# Patient Record
Sex: Male | Born: 1955 | Race: White | Hispanic: No | Marital: Single | Smoking: Never smoker
Health system: Southern US, Community
[De-identification: ages and names within clinical notes are randomized; demographics above are authoritative.]

## PROBLEM LIST (undated history)

## (undated) DIAGNOSIS — C4490 Unspecified malignant neoplasm of skin, unspecified: Secondary | ICD-10-CM

## (undated) DIAGNOSIS — G4730 Sleep apnea, unspecified: Secondary | ICD-10-CM

## (undated) DIAGNOSIS — E785 Hyperlipidemia, unspecified: Secondary | ICD-10-CM

## (undated) DIAGNOSIS — M1990 Unspecified osteoarthritis, unspecified site: Secondary | ICD-10-CM

## (undated) DIAGNOSIS — R7303 Prediabetes: Secondary | ICD-10-CM

## (undated) HISTORY — DX: Hyperlipidemia, unspecified: E78.5

---
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
## 1997-12-03 HISTORY — PX: HAND TENDON SURGERY: SHX663

## 1998-06-09 ENCOUNTER — Observation Stay (HOSPITAL_COMMUNITY): Admission: EM | Admit: 1998-06-09 | Discharge: 1998-06-09 | Payer: Self-pay | Admitting: Emergency Medicine

## 1999-06-26 ENCOUNTER — Inpatient Hospital Stay (HOSPITAL_COMMUNITY): Admission: EM | Admit: 1999-06-26 | Discharge: 1999-06-27 | Payer: Self-pay

## 1999-06-26 ENCOUNTER — Encounter: Payer: Self-pay | Admitting: Emergency Medicine

## 1999-07-03 ENCOUNTER — Ambulatory Visit (HOSPITAL_COMMUNITY): Admission: RE | Admit: 1999-07-03 | Discharge: 1999-07-03 | Payer: Self-pay

## 2005-04-19 ENCOUNTER — Ambulatory Visit (HOSPITAL_COMMUNITY): Admission: RE | Admit: 2005-04-19 | Discharge: 2005-04-19 | Payer: Self-pay | Admitting: Family Medicine

## 2005-04-30 ENCOUNTER — Emergency Department (HOSPITAL_COMMUNITY): Admission: EM | Admit: 2005-04-30 | Discharge: 2005-04-30 | Payer: Self-pay | Admitting: Emergency Medicine

## 2005-05-11 ENCOUNTER — Ambulatory Visit (HOSPITAL_COMMUNITY): Admission: RE | Admit: 2005-05-11 | Discharge: 2005-05-11 | Payer: Self-pay | Admitting: Neurosurgery

## 2005-12-03 HISTORY — PX: BACK SURGERY: SHX140

## 2012-10-18 ENCOUNTER — Encounter (HOSPITAL_COMMUNITY): Payer: Self-pay | Admitting: Emergency Medicine

## 2012-10-18 ENCOUNTER — Emergency Department (HOSPITAL_COMMUNITY)
Admission: EM | Admit: 2012-10-18 | Discharge: 2012-10-18 | Disposition: A | Discharge status: Home / Self Care | Payer: No Typology Code available for payment source | Attending: Emergency Medicine | Admitting: Emergency Medicine

## 2012-10-18 DIAGNOSIS — M549 Dorsalgia, unspecified: Secondary | ICD-10-CM

## 2012-10-18 DIAGNOSIS — S139XXA Sprain of joints and ligaments of unspecified parts of neck, initial encounter: Secondary | ICD-10-CM | POA: Insufficient documentation

## 2012-10-18 DIAGNOSIS — M545 Low back pain, unspecified: Secondary | ICD-10-CM | POA: Insufficient documentation

## 2012-10-18 DIAGNOSIS — S161XXA Strain of muscle, fascia and tendon at neck level, initial encounter: Secondary | ICD-10-CM

## 2012-10-18 DIAGNOSIS — Y9389 Activity, other specified: Secondary | ICD-10-CM | POA: Insufficient documentation

## 2012-10-18 MED ORDER — IBUPROFEN 400 MG PO TABS
600.0000 mg | ORAL_TABLET | Freq: Once | ORAL | Status: AC
  Administered 2012-10-18: 600 mg via ORAL
  Filled 2012-10-18: qty 1

## 2012-10-18 MED ORDER — NAPROXEN 500 MG PO TABS: 500.0000 mg | ORAL_TABLET | Freq: Two times a day (BID) | ORAL | Status: DC | PRN

## 2012-10-18 MED ORDER — DIAZEPAM 5 MG PO TABS: 5.0000 mg | ORAL_TABLET | Freq: Three times a day (TID) | ORAL | Status: DC | PRN
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
## 2017-03-07 ENCOUNTER — Ambulatory Visit: Payer: Self-pay | Admitting: Surgery

## 2017-03-07 NOTE — H&P (Signed)
Rodney Dougherty 03/07/2017 10:42 AM Location: [facility] Patient #: [id number] DOB: Jun 27, 1956 Divorced / Language: Rodney Dougherty / Race: White Male  History of Present Illness (Rodney Lazar A. Kae Heller Dougherty; 03/07/2017 12:33 Dougherty) Patient words: This very nice 61yo gentleman who presents with a recurrent left inguinal hernia. He started to notice a bulge about a year ago and it has increased in size. Does cause some sensation of pulling and stretching. No issues with incarceration, nausea vomiting or change in lower GI function. He had it repaired open with mesh in his early 70s. His other abdominal surgical history includes a pyloromyotomy as an infant. He does not smoke, occasional alcohol use, works as a Animator. Only medication is aspirin which he takes when he thinks about it.  The patient is a 61 year old male.   Past Surgical History Rodney Dougherty, Rodney Dougherty; 03/07/2017 11:31 AM) Laparoscopic Inguinal Hernia Surgery Left. Spinal Surgery - Lower Back  Diagnostic Studies History Rodney Dougherty, Rodney Dougherty; 03/07/2017 11:31 AM) Colonoscopy 5-10 years ago  Allergies Rodney Dougherty, Rodney Dougherty; 03/07/2017 11:32 AM) No Known Drug Allergies 03/07/2017  Medication History Rodney Dougherty, Rodney Dougherty; 03/07/2017 11:33 AM) No Current Medications Medications Reconciled  Social History Rodney Dougherty, Rodney Dougherty; 03/07/2017 11:31 AM) Alcohol use Occasional alcohol use. Caffeine use Carbonated beverages, Coffee, Tea. No drug use Tobacco use Never smoker.  Family History Rodney Dougherty, Rodney Dougherty; 03/07/2017 11:31 AM) Arthritis Mother. Heart Disease Brother. Heart disease in male family member before age 60 Respiratory Condition Mother.  Other Problems Rodney Dougherty, Rodney Dougherty; 03/07/2017 11:31 AM) Back Pain Hypercholesterolemia Inguinal Hernia     Review of Systems (Rodney Dougherty) General Not Present- Appetite Loss, Chills, Fatigue, Fever, Night Sweats, Weight Gain and Weight Loss. Skin Not Present-  Change in Wart/Mole, Dryness, Hives, Jaundice, New Lesions, Non-Healing Wounds, Rash and Ulcer. HEENT Not Present- Earache, Hearing Loss, Hoarseness, Nose Bleed, Oral Ulcers, Ringing in the Ears, Seasonal Allergies, Sinus Pain, Sore Throat, Visual Disturbances, Wears glasses/contact lenses and Yellow Eyes. Respiratory Not Present- Bloody sputum, Chronic Cough, Difficulty Breathing, Snoring and Wheezing. Breast Not Present- Breast Mass, Breast Pain, Nipple Discharge and Skin Changes. Cardiovascular Not Present- Chest Pain, Difficulty Breathing Lying Down, Leg Cramps, Palpitations, Rapid Heart Rate, Shortness of Breath and Swelling of Extremities. Gastrointestinal Not Present- Abdominal Pain, Bloating, Bloody Stool, Change in Bowel Habits, Chronic diarrhea, Constipation, Difficulty Swallowing, Excessive gas, Gets full quickly at meals, Hemorrhoids, Indigestion, Nausea, Rectal Pain and Vomiting. Male Genitourinary Not Present- Blood in Urine, Change in Urinary Stream, Frequency, Impotence, Nocturia, Painful Urination, Urgency and Urine Leakage. All other systems negative  Vitals (Rodney Dougherty Rodney Dougherty; 03/07/2017 11:32 AM) 03/07/2017 11:32 AM Weight: 194.5 lb Height: 68in Body Surface Area: 2.02 m Body Mass Index: 29.57 kg/m  Temp.: 97.89F  Pulse: 82 (Regular)  BP: 140/88 (Sitting, Left Arm, Standard)      Physical Exam (Rodney Dougherty; 03/07/2017 12:36 Dougherty)  General Note: He is alert and oriented, no distress  Integumentary Note: No lesions or rashes on limited skin exam  Head and Neck Note: No mass or thyromegaly  Eye Note: Anicteric, ocular motions intact  ENMT Note: Moist mucous membranes, a few missing teeth  Chest and Lung Exam Note: unlabored respiration, symmetrical air entry  Cardiovascular Note: reg rate and rhythm, no pedal edema  Abdomen Note: soft, nontender, nondistended. Small umbilical hernia. Reducible left inguinal hernia, no hernia on the  right. 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 I recommended laparoscopic repair given that he had repaired open in the past. I discussed with him the nature of the surgery including the use of the new piece of mesh, risks of bleeding, infection, pain, scarring, chronic groin pain, bladder injury or intra-abdominal injury, and recurrence of the hernia. He expressed understanding and desires to proceed.

## 2017-04-10 NOTE — Pre-Procedure Instructions (Signed)
Rodney Dougherty  04/10/2017      [facility], [place] [address] Phone: [phone] Fax: [phone]    Your procedure is scheduled on May 15  Report to [facility] Admitting at 1200 P.M.  Call this number if you have problems the morning of surgery:  [phone]   Remember:  Do not eat food or drink liquids after midnight.   Take these medicines the morning of surgery with A SIP OF WATER NONE  7 days prior to surgery STOP taking any Aspirin, Aleve, Naproxen, Ibuprofen, Motrin, Advil, Goody's, BC's, all herbal medications, fish oil, and all vitamins    Do not wear jewelry.  Do not wear lotions, powders, or cologne, or deoderant.   Men may shave face and neck.  Do not bring valuables to the hospital.  [facility] is not responsible for any belongings or valuables.  Contacts, dentures or bridgework may not be worn into surgery.  Leave your suitcase in the car.  After surgery it may be brought to your room.  For patients admitted to the hospital, discharge time will be determined by your treatment team.  Patients discharged the day of surgery will not be allowed to drive home.    Special instructions:   [place]- Preparing For Surgery  Before surgery, you can play an important role. Because skin is not sterile, your skin needs to be as free of germs as possible. You can reduce the number of germs on your skin by washing with CHG (chlorahexidine gluconate) Soap before surgery.  CHG is an antiseptic cleaner which kills germs and bonds with the skin to continue killing germs even after washing.  Please do not use if you have an allergy to CHG or antibacterial soaps. If your skin becomes reddened/irritated stop using the CHG.  Do not shave (including legs and underarms) for at least 48 hours prior to first CHG shower. It is OK to shave your face.  Please follow these  instructions carefully.   1. Shower the NIGHT BEFORE SURGERY and the MORNING OF SURGERY with CHG.   2. If you chose to wash your hair, wash your hair first as usual with your normal shampoo.  3. After you shampoo, rinse your hair and body thoroughly to remove the shampoo.  4. Use CHG as you would any other liquid soap. You can apply CHG directly to the skin and wash gently with a scrungie or a clean washcloth.   5. Apply the CHG Soap to your body ONLY FROM THE NECK DOWN.  Do not use on open wounds or open sores. Avoid contact with your eyes, ears, mouth and genitals (private parts). Wash genitals (private parts) with your normal soap.  6. Wash thoroughly, paying special attention to the area where your surgery will be performed.  7. Thoroughly rinse your body with warm water from the neck down.  8. DO NOT shower/wash with your normal soap after using and rinsing off the CHG Soap.  9. Pat yourself dry with a CLEAN TOWEL.   10. Wear CLEAN PAJAMAS   11. Place CLEAN SHEETS on your bed the night of your first shower and DO NOT SLEEP WITH PETS.    Day of Surgery: Do not apply any deodorants/lotions. Please wear clean clothes to the hospital/surgery center.      Please read over the following fact sheets that you were given.

## 2017-04-11 ENCOUNTER — Encounter (HOSPITAL_COMMUNITY)
Admission: RE | Admit: 2017-04-11 | Discharge: 2017-04-11 | Disposition: A | Discharge status: Home / Self Care | Payer: BLUE CROSS/BLUE SHIELD | Source: Ambulatory Visit | Attending: Surgery | Admitting: Surgery

## 2017-04-11 ENCOUNTER — Encounter (HOSPITAL_COMMUNITY): Payer: Self-pay

## 2017-04-11 DIAGNOSIS — D176 Benign lipomatous neoplasm of spermatic cord: Secondary | ICD-10-CM | POA: Diagnosis not present

## 2017-04-11 DIAGNOSIS — K4091 Unilateral inguinal hernia, without obstruction or gangrene, recurrent: Secondary | ICD-10-CM | POA: Diagnosis present

## 2017-04-11 DIAGNOSIS — K429 Umbilical hernia without obstruction or gangrene: Secondary | ICD-10-CM | POA: Diagnosis not present

## 2017-04-11 LAB — BASIC METABOLIC PANEL
Anion gap: 9 (ref 5–15)
BUN: 18 mg/dL (ref 6–20)
CHLORIDE: 110 mmol/L (ref 101–111)
CO2: 22 mmol/L (ref 22–32)
CREATININE: 0.97 mg/dL (ref 0.61–1.24)
Calcium: 8.9 mg/dL (ref 8.9–10.3)
GFR calc Af Amer: >60 mL/min (ref >60)
GFR calc non Af Amer: >60 mL/min (ref >60)
GLUCOSE: 115 mg/dL — AB (ref 65–99)
Potassium: 4.2 mmol/L (ref 3.5–5.1)
SODIUM: 141 mmol/L (ref 135–145)

## 2017-04-11 LAB — CBC WITH DIFFERENTIAL/PLATELET
Basophils Absolute: 0 10*3/uL (ref 0.0–0.1)
Basophils Relative: 1 %
EOS ABS: 0.3 10*3/uL (ref 0.0–0.7)
EOS PCT: 5 %
HCT: 45.9 % (ref 39.0–52.0)
Hemoglobin: 15.5 g/dL (ref 13.0–17.0)
LYMPHS PCT: 27 %
Lymphs Abs: 1.7 10*3/uL (ref 0.7–4.0)
MCH: 28.5 pg (ref 26.0–34.0)
MCHC: 33.8 g/dL (ref 30.0–36.0)
MCV: 84.4 fL (ref 78.0–100.0)
MONO ABS: 0.5 10*3/uL (ref 0.1–1.0)
Monocytes Relative: 8 %
Neutro Abs: 3.7 10*3/uL (ref 1.7–7.7)
Neutrophils Relative %: 59 %
PLATELETS: 216 10*3/uL (ref 150–400)
RBC: 5.44 MIL/uL (ref 4.22–5.81)
RDW: 13.4 % (ref 11.5–15.5)
WBC: 6.2 10*3/uL (ref 4.0–10.5)

## 2017-04-11 NOTE — Progress Notes (Signed)
Pt. Denies all chest concerns.  Pt. Denies ever having any advanced cardiac surveillance, in fact doesn't think he has ever had an EKG.  [facility]P- [facility].

## 2017-04-16 ENCOUNTER — Ambulatory Visit (HOSPITAL_COMMUNITY): Payer: BLUE CROSS/BLUE SHIELD | Admitting: Anesthesiology

## 2017-04-16 ENCOUNTER — Encounter (HOSPITAL_COMMUNITY): Payer: Self-pay

## 2017-04-16 ENCOUNTER — Encounter (HOSPITAL_COMMUNITY)
Admission: RE | Disposition: A | Discharge status: Home / Self Care | Payer: Self-pay | Source: Ambulatory Visit | Attending: Surgery

## 2017-04-16 ENCOUNTER — Ambulatory Visit (HOSPITAL_COMMUNITY)
Admission: RE | Admit: 2017-04-16 | Discharge: 2017-04-16 | Disposition: A | Discharge status: Home / Self Care | Payer: BLUE CROSS/BLUE SHIELD | Source: Ambulatory Visit | Attending: Surgery | Admitting: Surgery

## 2017-04-16 DIAGNOSIS — K4091 Unilateral inguinal hernia, without obstruction or gangrene, recurrent: Secondary | ICD-10-CM | POA: Diagnosis not present

## 2017-04-16 DIAGNOSIS — D176 Benign lipomatous neoplasm of spermatic cord: Secondary | ICD-10-CM | POA: Insufficient documentation

## 2017-04-16 DIAGNOSIS — K429 Umbilical hernia without obstruction or gangrene: Secondary | ICD-10-CM | POA: Insufficient documentation

## 2017-04-16 HISTORY — PX: INGUINAL HERNIA REPAIR: SHX194

## 2017-04-16 HISTORY — PX: INSERTION OF MESH: SHX5868

## 2017-04-16 SURGERY — REPAIR, HERNIA, INGUINAL, LAPAROSCOPIC
Anesthesia: General | Laterality: Left

## 2017-04-16 MED ORDER — ROCURONIUM BROMIDE 10 MG/ML (PF) SYRINGE
PREFILLED_SYRINGE | INTRAVENOUS | Status: AC
  Filled 2017-04-16: qty 5

## 2017-04-16 MED ORDER — EPHEDRINE 5 MG/ML INJ
INTRAVENOUS | Status: AC
  Filled 2017-04-16: qty 10

## 2017-04-16 MED ORDER — SODIUM CHLORIDE 0.9 % IV SOLN: 250.0000 mL | INTRAVENOUS | Status: DC | PRN

## 2017-04-16 MED ORDER — ACETAMINOPHEN 500 MG PO TABS
1000.0000 mg | ORAL_TABLET | ORAL | Status: AC
  Administered 2017-04-16: 1000 mg via ORAL
  Filled 2017-04-16: qty 2

## 2017-04-16 MED ORDER — NEOSTIGMINE METHYLSULFATE 5 MG/5ML IV SOSY
PREFILLED_SYRINGE | INTRAVENOUS | Status: AC
  Filled 2017-04-16: qty 5

## 2017-04-16 MED ORDER — CHLORHEXIDINE GLUCONATE 4 % EX LIQD: 60.0000 mL | Freq: Once | CUTANEOUS | Status: DC

## 2017-04-16 MED ORDER — DEXAMETHASONE SODIUM PHOSPHATE 10 MG/ML IJ SOLN
INTRAMUSCULAR | Status: DC | PRN
  Administered 2017-04-16: 10 mg via INTRAVENOUS

## 2017-04-16 MED ORDER — FENTANYL CITRATE (PF) 100 MCG/2ML IJ SOLN: 25.0000 ug | INTRAMUSCULAR | Status: DC | PRN

## 2017-04-16 MED ORDER — BUPIVACAINE HCL (PF) 0.25 % IJ SOLN
INTRAMUSCULAR | Status: AC
  Filled 2017-04-16: qty 30

## 2017-04-16 MED ORDER — DOCUSATE SODIUM 100 MG PO CAPS: 100.0000 mg | ORAL_CAPSULE | Freq: Two times a day (BID) | ORAL | 0 refills | Status: AC

## 2017-04-16 MED ORDER — ACETAMINOPHEN 325 MG PO TABS: 650.0000 mg | ORAL_TABLET | ORAL | Status: DC | PRN

## 2017-04-16 MED ORDER — STERILE WATER FOR IRRIGATION IR SOLN
Status: DC | PRN
  Administered 2017-04-16: 1000 mL

## 2017-04-16 MED ORDER — GABAPENTIN 300 MG PO CAPS
300.0000 mg | ORAL_CAPSULE | ORAL | Status: AC
  Administered 2017-04-16: 300 mg via ORAL
  Filled 2017-04-16: qty 1

## 2017-04-16 MED ORDER — MIDAZOLAM HCL 5 MG/5ML IJ SOLN
INTRAMUSCULAR | Status: DC | PRN
  Administered 2017-04-16: 2 mg via INTRAVENOUS

## 2017-04-16 MED ORDER — ONDANSETRON HCL 4 MG/2ML IJ SOLN
INTRAMUSCULAR | Status: DC | PRN
  Administered 2017-04-16: 4 mg via INTRAVENOUS

## 2017-04-16 MED ORDER — PROPOFOL 10 MG/ML IV BOLUS
INTRAVENOUS | Status: AC
  Filled 2017-04-16: qty 20

## 2017-04-16 MED ORDER — OXYCODONE HCL 5 MG PO TABS: 5.0000 mg | ORAL_TABLET | ORAL | Status: DC | PRN

## 2017-04-16 MED ORDER — SUGAMMADEX SODIUM 200 MG/2ML IV SOLN
INTRAVENOUS | Status: DC | PRN
  Administered 2017-04-16: 200 mg via INTRAVENOUS

## 2017-04-16 MED ORDER — SODIUM CHLORIDE 0.9% FLUSH: 3.0000 mL | Freq: Two times a day (BID) | INTRAVENOUS | Status: DC

## 2017-04-16 MED ORDER — ROCURONIUM BROMIDE 100 MG/10ML IV SOLN
INTRAVENOUS | Status: DC | PRN
  Administered 2017-04-16: 20 mg via INTRAVENOUS
  Administered 2017-04-16: 40 mg via INTRAVENOUS

## 2017-04-16 MED ORDER — MIDAZOLAM HCL 2 MG/2ML IJ SOLN
INTRAMUSCULAR | Status: AC
  Filled 2017-04-16: qty 2

## 2017-04-16 MED ORDER — CEFAZOLIN SODIUM-DEXTROSE 2-4 GM/100ML-% IV SOLN
2.0000 g | INTRAVENOUS | Status: AC
  Administered 2017-04-16: 2 g via INTRAVENOUS
  Filled 2017-04-16: qty 100

## 2017-04-16 MED ORDER — PROPOFOL 10 MG/ML IV BOLUS
INTRAVENOUS | Status: DC | PRN
  Administered 2017-04-16: 150 mg via INTRAVENOUS

## 2017-04-16 MED ORDER — 0.9 % SODIUM CHLORIDE (POUR BTL) OPTIME
TOPICAL | Status: DC | PRN
  Administered 2017-04-16: 1000 mL

## 2017-04-16 MED ORDER — PHENYLEPHRINE 40 MCG/ML (10ML) SYRINGE FOR IV PUSH (FOR BLOOD PRESSURE SUPPORT)
PREFILLED_SYRINGE | INTRAVENOUS | Status: AC
  Filled 2017-04-16: qty 10

## 2017-04-16 MED ORDER — LIDOCAINE HCL (CARDIAC) 20 MG/ML IV SOLN
INTRAVENOUS | Status: DC | PRN
  Administered 2017-04-16: 100 mg via INTRAVENOUS

## 2017-04-16 MED ORDER — FENTANYL CITRATE (PF) 100 MCG/2ML IJ SOLN
INTRAMUSCULAR | Status: DC | PRN
  Administered 2017-04-16: 50 ug via INTRAVENOUS
  Administered 2017-04-16: 150 ug via INTRAVENOUS
  Administered 2017-04-16: 50 ug via INTRAVENOUS

## 2017-04-16 MED ORDER — DEXTROSE 5 % IV SOLN
INTRAVENOUS | Status: DC | PRN
  Administered 2017-04-16: 15:00:00 via INTRAVENOUS

## 2017-04-16 MED ORDER — OXYCODONE-ACETAMINOPHEN 5-325 MG PO TABS: 1.0000 | ORAL_TABLET | Freq: Four times a day (QID) | ORAL | 0 refills | Status: DC | PRN

## 2017-04-16 MED ORDER — PHENYLEPHRINE HCL 10 MG/ML IJ SOLN
INTRAMUSCULAR | Status: DC | PRN
  Administered 2017-04-16: 80 ug via INTRAVENOUS

## 2017-04-16 MED ORDER — HYDROMORPHONE HCL 1 MG/ML IJ SOLN: 0.2500 mg | INTRAMUSCULAR | Status: DC | PRN

## 2017-04-16 MED ORDER — CELECOXIB 200 MG PO CAPS
400.0000 mg | ORAL_CAPSULE | ORAL | Status: AC
  Administered 2017-04-16: 400 mg via ORAL
  Filled 2017-04-16: qty 2

## 2017-04-16 MED ORDER — FENTANYL CITRATE (PF) 250 MCG/5ML IJ SOLN
INTRAMUSCULAR | Status: AC
  Filled 2017-04-16: qty 5

## 2017-04-16 MED ORDER — LACTATED RINGERS IV SOLN
INTRAVENOUS | Status: DC | PRN
  Administered 2017-04-16 (×2): via INTRAVENOUS

## 2017-04-16 MED ORDER — LIDOCAINE 2% (20 MG/ML) 5 ML SYRINGE
INTRAMUSCULAR | Status: AC
  Filled 2017-04-16: qty 5

## 2017-04-16 MED ORDER — ACETAMINOPHEN 650 MG RE SUPP: 650.0000 mg | RECTAL | Status: DC | PRN

## 2017-04-16 MED ORDER — SUGAMMADEX SODIUM 200 MG/2ML IV SOLN
INTRAVENOUS | Status: AC
  Filled 2017-04-16: qty 2

## 2017-04-16 MED ORDER — BUPIVACAINE HCL (PF) 0.25 % IJ SOLN
INTRAMUSCULAR | Status: DC | PRN
  Administered 2017-04-16: 20 mL

## 2017-04-16 MED ORDER — SODIUM CHLORIDE 0.9% FLUSH: 3.0000 mL | INTRAVENOUS | Status: DC | PRN

## 2017-04-16 SURGICAL SUPPLY — 39 items
ADH SKN CLS APL DERMABOND .7 (GAUZE/BANDAGES/DRESSINGS) ×1
APPLIER CLIP LOGIC TI 5 (MISCELLANEOUS) IMPLANT
APR CLP MED LRG 33X5 (MISCELLANEOUS)
CANISTER SUCT 3000ML PPV (MISCELLANEOUS) IMPLANT
COVER SURGICAL LIGHT HANDLE (MISCELLANEOUS) ×3 IMPLANT
DERMABOND ADVANCED (GAUZE/BANDAGES/DRESSINGS) ×2
DERMABOND ADVANCED .7 DNX12 (GAUZE/BANDAGES/DRESSINGS) ×1 IMPLANT
DEVICE PMI PUNCTURE CLOSURE (MISCELLANEOUS) ×3 IMPLANT
DEVICE SECURE STRAP 25 ABSORB (INSTRUMENTS) ×6 IMPLANT
DISSECT BALLN SPACEMKR + OVL (BALLOONS)
DISSECTOR BALLN SPACEMKR + OVL (BALLOONS) IMPLANT
DISSECTOR BLUNT TIP ENDO 5MM (MISCELLANEOUS) IMPLANT
ELECT REM PT RETURN 9FT ADLT (ELECTROSURGICAL) ×3
ELECTRODE REM PT RTRN 9FT ADLT (ELECTROSURGICAL) ×1 IMPLANT
GLOVE BIO SURGEON STRL SZ 6 (GLOVE) ×3 IMPLANT
GLOVE BIOGEL PI IND STRL 6.5 (GLOVE) ×1 IMPLANT
GLOVE BIOGEL PI IND STRL 7.5 (GLOVE) ×1 IMPLANT
GLOVE BIOGEL PI INDICATOR 6.5 (GLOVE) ×2
GLOVE BIOGEL PI INDICATOR 7.5 (GLOVE) ×2
GOWN STRL REUS W/ TWL LRG LVL3 (GOWN DISPOSABLE) ×3 IMPLANT
GOWN STRL REUS W/TWL LRG LVL3 (GOWN DISPOSABLE) ×9
KIT BASIN OR (CUSTOM PROCEDURE TRAY) ×3 IMPLANT
KIT ROOM TURNOVER OR (KITS) ×3 IMPLANT
MESH PHASIX RESORB RECT 10X15 (Mesh General) ×3 IMPLANT
NEEDLE INSUFFLATION 14GA 120MM (NEEDLE) IMPLANT
NS IRRIG 1000ML POUR BTL (IV SOLUTION) ×3 IMPLANT
PAD ARMBOARD 7.5X6 YLW CONV (MISCELLANEOUS) ×6 IMPLANT
SCISSORS LAP 5X35 DISP (ENDOMECHANICALS) ×3 IMPLANT
SET IRRIG TUBING LAPAROSCOPIC (IRRIGATION / IRRIGATOR) IMPLANT
SET TROCAR LAP APPLE-HUNT 5MM (ENDOMECHANICALS) IMPLANT
SLEEVE ENDOPATH XCEL 5M (ENDOMECHANICALS) ×3 IMPLANT
SUT MNCRL AB 4-0 PS2 18 (SUTURE) ×3 IMPLANT
TOWEL OR 17X24 6PK STRL BLUE (TOWEL DISPOSABLE) ×3 IMPLANT
TOWEL OR 17X26 10 PK STRL BLUE (TOWEL DISPOSABLE) ×3 IMPLANT
TRAY FOLEY CATH SILVER 16FR (SET/KITS/TRAYS/PACK) ×3 IMPLANT
TRAY LAPAROSCOPIC MC (CUSTOM PROCEDURE TRAY) ×3 IMPLANT
TROCAR XCEL NON-BLD 11X100MML (ENDOMECHANICALS) ×3 IMPLANT
TROCAR XCEL NON-BLD 5MMX100MML (ENDOMECHANICALS) ×3 IMPLANT
TUBING INSUFFLATION (TUBING) ×3 IMPLANT

## 2017-04-16 NOTE — Transfer of Care (Signed)
Immediate Anesthesia Transfer of Care Note  Patient: Rodney Dougherty  Procedure(s) Performed: Procedure(s): LAPAROSCOPIC LEFT INGUINAL HERNIA REPAIR (Left) INSERTION OF MESH (Left)  Patient Location: PACU  Anesthesia Type:General  Level of Consciousness: awake, oriented, sedated, patient cooperative and responds to stimulation  Airway & Oxygen Therapy: Patient Spontanous Breathing and Patient connected to nasal cannula oxygen  Post-op Assessment: Report given to RN, Post -op Vital signs reviewed and stable, Patient moving all extremities and Patient moving all extremities X 4  Post vital signs: Reviewed and stable  Last Vitals:  Vitals:   04/16/17 1224 04/16/17 1618  BP: 131/70 139/80  Pulse: (!) 56 (!) 58  Resp: 18   Temp: 36.4 C 36.5 C    Last Pain:  Vitals:   04/16/17 1224  TempSrc: Oral  PainSc:       Patients Stated Pain Goal: 1 (95/32/02 3343)  Complications: No apparent anesthesia complications

## 2017-04-16 NOTE — Anesthesia Procedure Notes (Signed)
Procedure Name: Intubation Date/Time: 04/16/2017 2:22 PM Performed by: Neldon Newport Pre-anesthesia Checklist: Timeout performed, Patient being monitored, Suction available, Emergency Drugs available and Patient identified Patient Re-evaluated:Patient Re-evaluated prior to inductionOxygen Delivery Method: Circle system utilized Preoxygenation: Pre-oxygenation with 100% oxygen Intubation Type: IV induction Ventilation: Mask ventilation without difficulty Laryngoscope Size: Mac and 4 Grade View: Grade I Tube type: Oral Tube size: 7.5 mm Number of attempts: 1 Placement Confirmation: breath sounds checked- equal and bilateral,  positive ETCO2 and ETT inserted through vocal cords under direct vision Secured at: 22 cm Tube secured with: Tape Dental Injury: Teeth and Oropharynx as per pre-operative assessment

## 2017-04-16 NOTE — Anesthesia Preprocedure Evaluation (Signed)
Anesthesia Evaluation  Patient identified by MRN, date of birth, ID band Patient awake    Reviewed: Allergy & Precautions, H&P , Patient's Chart, lab work & pertinent test results, reviewed documented beta blocker date and time   Airway Mallampati: II  TM Distance: >3 FB Neck ROM: full    Dental no notable dental hx.    Pulmonary    Pulmonary exam normal breath sounds clear to auscultation       Cardiovascular  Rhythm:regular Rate:Normal     Neuro/Psych    GI/Hepatic   Endo/Other    Renal/GU      Musculoskeletal   Abdominal   Peds  Hematology   Anesthesia Other Findings   Reproductive/Obstetrics                             Anesthesia Physical Anesthesia Plan  ASA: II  Anesthesia Plan: General   Post-op Pain Management:    Induction: Intravenous  Airway Management Planned: Oral ETT  Additional Equipment:   Intra-op Plan:   Post-operative Plan: Extubation in OR  Informed Consent: I have reviewed the patients History and Physical, chart, labs and discussed the procedure including the risks, benefits and alternatives for the proposed anesthesia with the patient or authorized representative who has indicated his/her understanding and acceptance.   Dental Advisory Given  Plan Discussed with: CRNA and Surgeon  Anesthesia Plan Comments: (  )        Anesthesia Quick Evaluation

## 2017-04-16 NOTE — Discharge Instructions (Signed)
HERNIA REPAIR: POST OP INSTRUCTIONS  ######################################################################  EAT Gradually transition to a high fiber diet with a fiber supplement over the next few weeks after discharge.  Start with a pureed / full liquid diet (see below)  WALK Walk an hour a day.  Control your pain to do that.    CONTROL PAIN Control pain so that you can walk, sleep, tolerate sneezing/coughing, go up/down stairs.  HAVE A BOWEL MOVEMENT DAILY Keep your bowels regular to avoid problems.  OK to try a laxative to override constipation.  OK to use an antidairrheal to slow down diarrhea.  Call if not better after 2 tries  CALL IF YOU HAVE PROBLEMS/CONCERNS Call if you are still struggling despite following these instructions. Call if you have concerns not answered by these instructions  ######################################################################    1. DIET: Follow a light bland diet the first 24 hours after arrival home, such as soup, liquids, crackers, etc.  Be sure to include lots of fluids daily.  Avoid fast food or heavy meals as your are more likely to get nauseated.  Eat a low fat the next few days after surgery. 2. Take your usually prescribed home medications unless otherwise directed. 3. PAIN CONTROL: a. Pain is best controlled by a usual combination of three different methods TOGETHER: i. Ice/Heat ii. Over the counter pain medication iii. Prescription pain medication b. Most patients will experience some swelling and bruising around the hernia(s) such as the bellybutton, groins, or old incisions.  Ice packs or heating pads (30-60 minutes up to 6 times a day) will help. Use ice for the first few days to help decrease swelling and bruising, then switch to heat to help relax tight/sore spots and speed recovery.  Some people prefer to use ice alone, heat alone, alternating between ice & heat.  Experiment to what works for you.  Swelling and bruising can take  several weeks to resolve.   c. It is helpful to take an over-the-counter pain medication regularly for the first few weeks.  Choose one of the following that works best for you: i. Naproxen (Aleve, etc)  Two 220mg  tabs twice a day ii. Ibuprofen (Advil, etc) Three 200mg  tabs four times a day (every meal & bedtime) iii. Acetaminophen (Tylenol, etc) 325-650mg  four times a day (every meal & bedtime) d. A  prescription for pain medication should be given to you upon discharge.  Take your pain medication as prescribed.  i. If you are having problems/concerns with the prescription medicine (does not control pain, nausea, vomiting, rash, itching, etc), please call us [phone] to see if we need to switch you to a different pain medicine that will work better for you and/or control your side effect better. ii. If you need a refill on your pain medication, please contact your pharmacy.  They will contact our office to request authorization. Prescriptions will not be filled after 5 pm or on week-ends. 4. Avoid getting constipated.  Between the surgery and the pain medications, it is common to experience some constipation.  Increasing fluid intake and taking a fiber supplement (such as Metamucil, Citrucel, FiberCon, MiraLax, etc) 1-2 times a day regularly will usually help prevent this problem from occurring.  A mild laxative (prune juice, Milk of Magnesia, MiraLax, etc) should be taken according to package directions if there are no bowel movements after 48 hours.   5. Wash / shower every day.  You may shower over the skin glue which is water proof.  6. The skin glue will flake off after a couple weeks.  You may leave the incision open to air.  You may replace a dressing/Band-Aid to cover the incision for comfort if you wish.      7. ACTIVITIES as tolerated:   a. You may resume regular (light) daily activities beginning the next day--such as daily self-care, walking, climbing stairs--gradually increasing  activities as tolerated.  If you can walk 30 minutes without difficulty, it is safe to try more intense activity such as jogging, treadmill, bicycling, low-impact aerobics, swimming, etc. b. Save the most intensive and strenuous activity for last such as sit-ups, heavy lifting, contact sports, etc  Refrain from any heavy lifting or straining until you are off narcotics for pain control.   c. DO NOT PUSH THROUGH PAIN.  Let pain be your guide: If it hurts to do something, don't do it.  Pain is your body warning you to avoid that activity for another week until the pain goes down. d. You may drive when you are no longer taking prescription pain medication, you can comfortably wear a seatbelt, and you can safely maneuver your car and apply brakes. e. Dennis Bast may have sexual intercourse when it is comfortable.  8. FOLLOW UP in our office a. Please call [facility] at [phone] to set up an appointment to see your surgeon in the office for a follow-up appointment approximately 2-3 weeks after your surgery. b. Make sure that you call for this appointment the day you arrive home to insure a convenient appointment time. 9.  IF YOU HAVE DISABILITY OR FAMILY LEAVE FORMS, BRING THEM TO THE OFFICE FOR PROCESSING.  DO NOT GIVE THEM TO YOUR DOCTOR.  WHEN TO CALL us [phone]: 1. Poor pain control 2. Reactions / problems with new medications (rash/itching, nausea, etc)  3. Fever over 101.5 F (38.5 C) 4. Inability to urinate 5. Nausea and/or vomiting 6. Worsening swelling or bruising 7. Continued bleeding from incision. 8. Increased pain, redness, or drainage from the incision   The clinic staff is available to answer your questions during regular business hours (8:30am-5pm).  Please dont hesitate to call and ask to speak to one of our nurses for clinical concerns.   If you have a medical emergency, go to the nearest emergency room or call 911.  A surgeon from [facility] is always on call at the  hospitals in [facility], [place], [place], [place], [place]  [address] ?  [address] [address], [place], [place]   [address] MAIN: [phone] ? TOLL FREE: [phone] ? FAX: [phone] [email or web address]

## 2017-04-16 NOTE — H&P (Signed)
Rodney Dougherty Patient #: [id number] DOB: [birth year] Divorced / Language: [name] / Race: White Male  History of Present Illness  Patient words: This very nice 61yo gentleman who presents with a recurrent left inguinal hernia. He started to notice a bulge about a year ago and it has increased in size. Does cause some sensation of pulling and stretching. No issues with incarceration, nausea vomiting or change in lower GI function. He had it repaired open with mesh in his early 52s. His other abdominal surgical history includes a pyloromyotomy as an infant. He does not smoke, occasional alcohol use, works as a Animator. Only medication is aspirin which he takes when he thinks about it.    Past Surgical History Laparoscopic Inguinal Hernia Surgery Left. Spinal Surgery - Lower Back  Diagnostic Studies History  Colonoscopy 5-10 years ago  Allergies  No Known Drug Allergies 03/07/2017  Medication History  No Current Medications Medications Reconciled  Social History  Alcohol use Occasional alcohol use. Caffeine use Carbonated beverages, Coffee, Tea. No drug use Tobacco use Never smoker.  Family History  Arthritis Mother. Heart Disease Brother. Heart disease in male family member before age 46 Respiratory Condition Mother.  Other Problems  Back Pain Hypercholesterolemia Inguinal Hernia     Review of Systems  General Not Present- Appetite Loss, Chills, Fatigue, Fever, Night Sweats, Weight Gain and Weight Loss. Skin Not Present- Change in Wart/Mole, Dryness, Hives, Jaundice, New Lesions, Non-Healing Wounds, Rash and Ulcer. HEENT Not Present- Earache, Hearing Loss, Hoarseness, Nose Bleed, Oral Ulcers, Ringing in the Ears, Seasonal Allergies, Sinus Pain, Sore Throat, Visual Disturbances, Wears glasses/contact lenses and Yellow Eyes. Respiratory Not Present- Bloody sputum, Chronic Cough, Difficulty Breathing, Snoring and Wheezing. Breast Not  Present- Breast Mass, Breast Pain, Nipple Discharge and Skin Changes. Cardiovascular Not Present- Chest Pain, Difficulty Breathing Lying Down, Leg Cramps, Palpitations, Rapid Heart Rate, Shortness of Breath and Swelling of Extremities. Gastrointestinal Not Present- Abdominal Pain, Bloating, Bloody Stool, Change in Bowel Habits, Chronic diarrhea, Constipation, Difficulty Swallowing, Excessive gas, Gets full quickly at meals, Hemorrhoids, Indigestion, Nausea, Rectal Pain and Vomiting. Male Genitourinary Not Present- Blood in Urine, Change in Urinary Stream, Frequency, Impotence, Nocturia, Painful Urination, Urgency and Urine Leakage. All other systems negative  There were no vitals filed for this visit.    Physical Exam  General Note: He is alert and oriented, no distress  Integumentary Note: No lesions or rashes on limited skin exam  Head and Neck Note: No mass or thyromegaly  Eye Note: Anicteric, ocular motions intact  ENMT Note: Moist mucous membranes, a few missing teeth  Chest and Lung Exam Note: unlabored respiration, symmetrical air entry  Cardiovascular Note: reg rate and rhythm, no pedal edema  Abdomen Note: soft, nontender, nondistended. Small umbilical hernia. Reducible left inguinal hernia, no hernia on the right. No mass or organomegaly  Neurologic Note: grossly intact, normal gait  Neuropsychiatric Note: normal mood and affect, appropriate insight  Musculoskeletal Note: strength symmetrical throughout, no deformity    Assessment & Plan   RECURRENT LEFT INGUINAL HERNIA (K40.91) Story: Reducible. I recommended laparoscopic repair given that he had repaired open in the past. I discussed with him the nature of the surgery including the use of the new piece of mesh, risks of bleeding, infection, pain, scarring, chronic groin pain, bladder injury or intra-abdominal injury, and recurrence of the hernia. He expressed understanding and  desires to proceed. The left side has been marked.

## 2017-04-16 NOTE — Op Note (Signed)
Operative Note  JOVONTAE BANKO  [id number]  [id number]  04/16/2017   Surgeon: Clovis Riley  Assistant: OR staff  Procedure performed: laparoscopic repair of left indirect sliding inguinal hernia with phasix mesh (TAPP approach), primary repair of umbilical hernia  Preop diagnosis: recurrent left inguinal hernia Post-op diagnosis/intraop findings: sliding left indirect inguinal hernia, small cord lipoma,  <7RF umbilical hernia  Specimens: none Retained items: none EBL: minimal cc Complications: none  Description of procedure: After obtaining informed consent the patient was taken to the operating room and placed supine on operating room table wheregeneral endotracheal anesthesia was initiated, preoperative antibiotics were administered, SCDs applied, and a formal timeout was performed. The abdomen was clipped prepped and draped in the usual sterile fashion. He was noted to have a scar in the right groin but no scar in the left although he had reported prior left inguinal hernia repair. A small umbilical hernia with a defect in the fascia less than 1 cm was appreciated. A curvilinear incision was made just inferior to the umbilicus and the soft tissues below dissected until the hernia sac at the umbilicus was encountered and entered gaining access to the peritoneal cavity. An 11 mm trocar was placed through this defect and the abdomen was insufflated to 15 mmHg. The patient was placed in steep Trendelenburg and the pelvis was inspected: There was no evidence of recurrent hernia on the right side. There was an indirect hernia on the left side with sigmoid colon herniating through the defect and forming part of the sac. There were a few filmy adhesions in the pelvis which were left undisturbed. Using cautery and sharp dissection, the peritoneal flap was dissected away from the anterior abdominal wall beginning just medial to the anterior superior iliac spine and proceeding across to the  medial umbilical ligament. With gentle blunt and sharp dissection, the flap was developed down below the level of the inguinal ligament and the hernia sac reduced from the indirect defect space. A small cord lipoma was encountered and excised. The vas deferens and testicular vessels were visualized and preserved. Care was taken to avoid any injury to the underlying sigmoid colon. The preperitoneal space was developed to create an appropriate size area for mesh implantation. The Cooper's ligament was exposed.  It did seem as though there was a layer of peritoneum between the sigmoid colon and the pre-peritoneal space but given concern for sliding hernia I elected to use phasix mesh for the hernia repair. The mesh was trimmed to see the defect and then introduced through the 11 mm trocar and directed to lie flat within the preperitoneal space with several centimeters of overlap around the indirect hernia defect circumferentially. Using the secure strap tacker, the mesh was secured to the Cooper's ligament and on either side of the inferior epigastric vessels superiorly. No tacks were placed inferiorly. The peritoneal flap was then brought back up to cover the mesh, ensuring that the mesh remained flat and did not buckle. The peritoneal flap was reapposed the anterior abdominal wall using the secure strap tacker again taking care to avoid injury to the sigmoid colon which was intimately adherent to the peritoneal flap. A laparoscopic assisted taps block was performed on the left side. The camera was removed and the abdomen was desufflated and the 11 mm trocar was removed. The hernia defect here was closed with interrupted 0 Vicryls. The abdomen was then reinsufflated and the repair inspected; it was noted to be airtight and free of any entrapped  structures. Gross inspection confirmed that the abdomen was hemostatic, hernia repair was intact and no mesh was exposed. The camera was removed abdomen was desufflated and all  trochars removed. The skin incisions were closed with running subcuticular Monocryl and Dermabond. The patient was then awakened, extubated and taken to PACU in stable condition.   All counts were correct at the completion of the case.

## 2017-04-17 ENCOUNTER — Encounter (HOSPITAL_COMMUNITY): Payer: Self-pay | Admitting: Surgery

## 2017-04-18 NOTE — Anesthesia Postprocedure Evaluation (Signed)
Anesthesia Post Note  Patient: PENG THORSTENSON  Procedure(s) Performed: Procedure(s) (LRB): LAPAROSCOPIC LEFT INGUINAL HERNIA REPAIR (Left) INSERTION OF MESH (Left)  Patient location during evaluation: PACU Anesthesia Type: General Level of consciousness: awake and alert Pain management: pain level controlled Vital Signs Assessment: post-procedure vital signs reviewed and stable Respiratory status: spontaneous breathing, nonlabored ventilation, respiratory function stable and patient connected to nasal cannula oxygen Cardiovascular status: blood pressure returned to baseline and stable Postop Assessment: no signs of nausea or vomiting Anesthetic complications: no       Last Vitals:  Vitals:   04/16/17 1704 04/16/17 1706  BP:  132/77  Pulse: 63 60  Resp: 12 19  Temp: 36.4 C     Last Pain:  Vitals:   04/16/17 1224  TempSrc: Oral  PainSc:                  Riccardo Dubin

## 2017-06-14 NOTE — Addendum Note (Signed)
Addendum  created 06/14/17 1244 by Shakevia Sarris, MD   Sign clinical note    

## 2017-06-14 NOTE — Anesthesia Postprocedure Evaluation (Signed)
Anesthesia Post Note  Patient: Rodney Dougherty  Procedure(s) Performed: Procedure(s) (LRB): LAPAROSCOPIC LEFT INGUINAL HERNIA REPAIR (Left) INSERTION OF MESH (Left)     Anesthesia Post Evaluation  Last Vitals:  Vitals:   04/16/17 1704 04/16/17 1706  BP:  132/77  Pulse: 63 60  Resp: 12 19  Temp: 36.4 C     Last Pain:  Vitals:   04/16/17 1224  TempSrc: Oral  PainSc:                  Riccardo Dubin

## 2020-02-17 ENCOUNTER — Ambulatory Visit: Payer: 59 | Admitting: Family Medicine

## 2020-02-17 ENCOUNTER — Other Ambulatory Visit: Payer: Self-pay

## 2020-02-17 ENCOUNTER — Encounter: Payer: Self-pay | Admitting: Gastroenterology

## 2020-02-17 ENCOUNTER — Encounter: Payer: Self-pay | Admitting: Family Medicine

## 2020-02-17 VITALS — BP 138/62 | HR 61 | Temp 98.2°F | Ht 67.75 in | Wt 195.1 lb

## 2020-02-17 DIAGNOSIS — E7800 Pure hypercholesterolemia, unspecified: Secondary | ICD-10-CM

## 2020-02-17 DIAGNOSIS — M25551 Pain in right hip: Secondary | ICD-10-CM

## 2020-02-17 DIAGNOSIS — Z125 Encounter for screening for malignant neoplasm of prostate: Secondary | ICD-10-CM | POA: Diagnosis not present

## 2020-02-17 DIAGNOSIS — Z7689 Persons encountering health services in other specified circumstances: Secondary | ICD-10-CM | POA: Diagnosis not present

## 2020-02-17 DIAGNOSIS — R7303 Prediabetes: Secondary | ICD-10-CM | POA: Diagnosis not present

## 2020-02-17 DIAGNOSIS — Z1211 Encounter for screening for malignant neoplasm of colon: Secondary | ICD-10-CM

## 2020-02-17 DIAGNOSIS — Z23 Encounter for immunization: Secondary | ICD-10-CM | POA: Diagnosis not present

## 2020-02-17 LAB — CBC WITH DIFFERENTIAL/PLATELET
Basophils Absolute: 0.1 10*3/uL (ref 0.0–0.1)
Basophils Relative: 0.9 % (ref 0.0–3.0)
Eosinophils Absolute: 0.2 10*3/uL (ref 0.0–0.7)
Eosinophils Relative: 3.8 % (ref 0.0–5.0)
HCT: 44 % (ref 39.0–52.0)
Hemoglobin: 14.7 g/dL (ref 13.0–17.0)
Lymphocytes Relative: 24.9 % (ref 12.0–46.0)
Lymphs Abs: 1.5 10*3/uL (ref 0.7–4.0)
MCHC: 33.5 g/dL (ref 30.0–36.0)
MCV: 84.1 fl (ref 78.0–100.0)
Monocytes Absolute: 0.5 10*3/uL (ref 0.1–1.0)
Monocytes Relative: 7.7 % (ref 3.0–12.0)
Neutro Abs: 3.9 10*3/uL (ref 1.4–7.7)
Neutrophils Relative %: 62.7 % (ref 43.0–77.0)
Platelets: 246 10*3/uL (ref 150.0–400.0)
RBC: 5.23 Mil/uL (ref 4.22–5.81)
RDW: 14.5 % (ref 11.5–15.5)
WBC: 6.2 10*3/uL (ref 4.0–10.5)

## 2020-02-17 LAB — COMPREHENSIVE METABOLIC PANEL
ALT: 16 U/L (ref 0–53)
AST: 15 U/L (ref 0–37)
Albumin: 4 g/dL (ref 3.5–5.2)
Alkaline Phosphatase: 80 U/L (ref 39–117)
BUN: 15 mg/dL (ref 6–23)
CO2: 29 mEq/L (ref 19–32)
Calcium: 9.1 mg/dL (ref 8.4–10.5)
Chloride: 107 mEq/L (ref 96–112)
Creatinine, Ser: 0.98 mg/dL (ref 0.40–1.50)
GFR: 77.55 mL/min (ref >60.00)
Glucose, Bld: 114 mg/dL — ABNORMAL HIGH (ref 70–99)
Potassium: 4.2 mEq/L (ref 3.5–5.1)
Sodium: 140 mEq/L (ref 135–145)
Total Bilirubin: 0.6 mg/dL (ref 0.2–1.2)
Total Protein: 6.6 g/dL (ref 6.0–8.3)

## 2020-02-17 LAB — LIPID PANEL
Cholesterol: 219 mg/dL — ABNORMAL HIGH (ref 0–200)
HDL: 34.3 mg/dL — ABNORMAL LOW (ref >39.00)
LDL Cholesterol: 157 mg/dL — ABNORMAL HIGH (ref 0–99)
NonHDL: 185.07
Total CHOL/HDL Ratio: 6
Triglycerides: 142 mg/dL (ref 0.0–149.0)
VLDL: 28.4 mg/dL (ref 0.0–40.0)

## 2020-02-17 LAB — PSA: PSA: 0.4 ng/mL (ref 0.10–4.00)

## 2020-02-17 LAB — HEMOGLOBIN A1C: Hgb A1c MFr Bld: 5.8 % (ref 4.6–6.5)

## 2020-02-17 NOTE — Progress Notes (Signed)
Subjective:    Patient ID: Rodney Dougherty, male    DOB: [birth year], 64 y.o.   MRN: [id number]  HPI Chief Complaint  Patient presents with  . New Patient (Initial Visit)    Right leg pain, decreased ROM. Difficulty bending at hip. Denies numbness or tingling   This is a 64 yo male who presents today to establish care. Previously seen at [facility]. No care in approximately 2 years. Does wood working. Enjoys dining out. Has sons and grandkids.     Last CPE- 2 years PSA- unsure, will have today Colonoscopy- 2010 Tdap- 04/16/2008 Flu- declines Dental- full dentures Eye- overdue Exercise- active at work  Right groin pain x 4-5 months. Saw someone at [facility], had xray, told it was a "torn tendon." Pain with flexing hip, some pain at night. Able to do his job. Takes occasional asa or advil, sometimes helps, not always. Disrupted sleep.   Review of Systems Denies chest pain, SOB, leg swelling    Objective:   Physical Exam Vitals reviewed.  Constitutional:      General: He is not in acute distress.    Appearance: Normal appearance. He is normal weight. He is not ill-appearing, toxic-appearing or diaphoretic.  HENT:     Head: Normocephalic and atraumatic.     Right Ear: External ear normal.     Left Ear: External ear normal.  Eyes:     Conjunctiva/sclera: Conjunctivae normal.  Cardiovascular:     Rate and Rhythm: Normal rate and regular rhythm.     Heart sounds: Normal heart sounds.  Pulmonary:     Effort: Pulmonary effort is normal.     Breath sounds: Normal breath sounds.  Musculoskeletal:     Cervical back: Normal range of motion and neck supple.     Right hip: Tenderness: right groin. Decreased range of motion (flexion, extension, internal and external rotation). Normal strength.     Left hip: Normal.     Right lower leg: No edema.     Left lower leg: No edema.  Skin:    General: Skin is warm and dry.  Neurological:     Mental Status: He is alert and oriented to person,  place, and time.  Psychiatric:        Mood and Affect: Mood normal.        Behavior: Behavior normal.        Thought Content: Thought content normal.        Judgment: Judgment normal.       BP 138/62 (BP Location: Left Arm, Patient Position: Sitting, Cuff Size: Normal)   Pulse 61   Temp 98.2 F (36.8 C) (Temporal)   Ht 5' 7.75" (1.721 m)   Wt 195 lb 1.9 oz (88.5 kg)   SpO2 96%   BMI 29.89 kg/m      Assessment & Plan:  1. Encounter to establish care - will request records  2. Right hip pain - Ambulatory referral to Orthopedic Surgery  3. Screening for colon cancer - Ambulatory referral to Gastroenterology  4. Prediabetes - CBC with Differential - Comprehensive metabolic panel - Lipid Panel - Hemoglobin A1c  5. Hypercholesteremia - CBC with Differential - Comprehensive metabolic panel - Lipid Panel  6. Screening for prostate cancer - PSA  7. Need for Tdap vaccination - Tdap vaccine greater than or equal to 7yo IM  - follow up in 6 months for CPE  This visit occurred during the SARS-CoV-2 public health emergency.  Safety protocols were in  place, including screening questions prior to the visit, additional usage of staff PPE, and extensive cleaning of exam room while observing appropriate contact time as indicated for disinfecting solutions.    Clarene Reamer, FNP-BC  [facility], [facility]  02/17/2020 10:21 AM

## 2020-02-17 NOTE — Patient Instructions (Signed)
Good to see you today  Follow up in 6 months for your complete physical  I will notify you of lab results

## 2020-02-19 MED ORDER — ROSUVASTATIN CALCIUM 10 MG PO TABS: 10.0000 mg | ORAL_TABLET | Freq: Every day | ORAL | 3 refills | Status: DC

## 2020-02-19 NOTE — Addendum Note (Signed)
Addended by: Clarene Reamer B on: 02/19/2020 07:50 AM   Modules accepted: Orders

## 2020-02-25 ENCOUNTER — Other Ambulatory Visit: Payer: Self-pay

## 2020-02-25 ENCOUNTER — Ambulatory Visit (INDEPENDENT_AMBULATORY_CARE_PROVIDER_SITE_OTHER): Payer: 59 | Admitting: Orthopaedic Surgery

## 2020-02-25 ENCOUNTER — Ambulatory Visit (INDEPENDENT_AMBULATORY_CARE_PROVIDER_SITE_OTHER): Payer: 59

## 2020-02-25 DIAGNOSIS — M1611 Unilateral primary osteoarthritis, right hip: Secondary | ICD-10-CM

## 2020-02-25 NOTE — Progress Notes (Signed)
Office Visit Note   Patient: Rodney Dougherty           Date of Birth: May 25, 1956           MRN: [id number] Visit Date: 02/25/2020              Requested by: Rodney Dougherty, [place],  [place] [id number] PCP: Rodney Beck, FNP   Assessment & Plan: Visit Diagnoses:  1. Primary osteoarthritis of right hip     Plan: Impression is moderately severe right hip DJD.  We reviewed the x-rays in detail and we had a full discussion on treatment options and based on our discussion he would like to try some over-the-counter aspirin and activity modifications.  He has declined prescription NSAID and cortisone injections today.  He knows he can call us anytime if he changes his mind.  Otherwise we will see him back as needed.  He has my card if he needs me.  Follow-Up Instructions: Return if symptoms worsen or fail to improve.   Orders:  Orders Placed This Encounter  Procedures  . XR HIP UNILAT W OR W/O PELVIS 2-3 VIEWS RIGHT   No orders of the defined types were placed in this encounter.     Procedures: No procedures performed   Clinical Data: No additional findings.   Subjective: Chief Complaint  Patient presents with  . Right Hip - Pain    [name] is a very pleasant 64 year old gentleman who comes in for evaluation of increasing right hip pain for the last 44months.  He denies any injuries and is currently not taking any medications.  He endorses groin pain as well.  He denies any numbness and tingling.  He is still working in Architect.  He has developed significant limp.  He has a lot of difficulty putting on socks and shoes.  He has trouble with ladders and he does have some night pain.   Review of Systems  Constitutional: Negative.   All other systems reviewed and are negative.    Objective: Vital Signs: There were no vitals taken for this visit.  Physical Exam Vitals and nursing note reviewed.  Constitutional:      Appearance: He is  well-developed.  HENT:     Head: Normocephalic and atraumatic.  Eyes:     Pupils: Pupils are equal, round, and reactive to light.  Pulmonary:     Effort: Pulmonary effort is normal.  Abdominal:     Palpations: Abdomen is soft.  Musculoskeletal:        General: Normal range of motion.     Cervical back: Neck supple.  Skin:    General: Skin is warm.  Neurological:     Mental Status: He is alert and oriented to person, place, and time.  Psychiatric:        Behavior: Behavior normal.        Thought Content: Thought content normal.        Judgment: Judgment normal.     Ortho Exam Right hip is painful to internal and external rotation with moderate limitation in range of motion.  Negative logroll.  Positive Stinchfield.  No tenderness. Specialty Comments:  No specialty comments available.  Imaging: XR HIP UNILAT W OR W/O PELVIS 2-3 VIEWS RIGHT  Result Date: 02/25/2020 Moderately severe right hip DJD.    PMFS History: Patient Active Problem List   Diagnosis Date Noted  . Primary osteoarthritis of right hip 02/25/2020   No past  medical history on file.  Family History  Problem Relation Age of Onset  . Stroke Mother     Past Surgical History:  Procedure Laterality Date  . BACK SURGERY     pt. reports it was about 20 yrs. ago, no problems since then    . HAND TENDON SURGERY Left 1999   occupational injury involving artery as well  . HERNIA REPAIR Left 1975   inguinal   . INGUINAL HERNIA REPAIR Left 04/16/2017   Procedure: LAPAROSCOPIC LEFT INGUINAL HERNIA REPAIR;  Surgeon: Rodney Riley, MD;  Location: [facility];  Service: General;  Laterality: Left;  . INSERTION OF MESH Left 04/16/2017   Procedure: INSERTION OF MESH;  Surgeon: Rodney Riley, MD;  Location: [facility] OR;  Service: General;  Laterality: Left;   Social History   Occupational History  . Occupation: Full time  Tobacco Use  . Smoking status: Never Smoker  . Smokeless tobacco: Never Used  Substance and  Sexual Activity  . Alcohol use: Yes    Comment: occasionally, very rare  . Drug use: No  . Sexual activity: Not on file

## 2020-03-09 ENCOUNTER — Ambulatory Visit: Payer: 59 | Admitting: *Deleted

## 2020-03-09 ENCOUNTER — Other Ambulatory Visit: Payer: Self-pay

## 2020-03-09 VITALS — Temp 96.8°F | Ht 67.75 in | Wt 196.4 lb

## 2020-03-09 DIAGNOSIS — Z1211 Encounter for screening for malignant neoplasm of colon: Secondary | ICD-10-CM

## 2020-03-09 DIAGNOSIS — Z01818 Encounter for other preprocedural examination: Secondary | ICD-10-CM

## 2020-03-09 MED ORDER — NA SULFATE-K SULFATE-MG SULF 17.5-3.13-1.6 GM/177ML PO SOLN: 1.0000 | Freq: Once | ORAL | 0 refills | Status: AC

## 2020-03-09 NOTE — Progress Notes (Signed)

## 2020-03-18 ENCOUNTER — Ambulatory Visit (INDEPENDENT_AMBULATORY_CARE_PROVIDER_SITE_OTHER): Payer: 59

## 2020-03-18 ENCOUNTER — Other Ambulatory Visit: Payer: Self-pay | Admitting: Gastroenterology

## 2020-03-18 DIAGNOSIS — Z1159 Encounter for screening for other viral diseases: Secondary | ICD-10-CM

## 2020-03-19 LAB — SARS CORONAVIRUS 2 (TAT 6-24 HRS): SARS Coronavirus 2: NEGATIVE

## 2020-03-23 ENCOUNTER — Encounter: Payer: Self-pay | Admitting: Gastroenterology

## 2020-03-23 ENCOUNTER — Other Ambulatory Visit: Payer: Self-pay

## 2020-03-23 ENCOUNTER — Ambulatory Visit (AMBULATORY_SURGERY_CENTER): Payer: 59 | Admitting: Gastroenterology

## 2020-03-23 VITALS — BP 121/79 | HR 57 | Temp 96.9°F | Resp 14 | Ht 67.75 in | Wt 196.4 lb

## 2020-03-23 DIAGNOSIS — Z1211 Encounter for screening for malignant neoplasm of colon: Secondary | ICD-10-CM

## 2020-03-23 MED ORDER — SODIUM CHLORIDE 0.9 % IV SOLN: 500.0000 mL | Freq: Once | INTRAVENOUS | Status: DC

## 2020-03-23 NOTE — Progress Notes (Signed)
Pt Drowsy. VSS. To PACU, report to RN. No anesthetic complications noted.  

## 2020-03-23 NOTE — Op Note (Signed)
Rodney Dougherty Patient Name: Rodney Dougherty Procedure Date: 03/23/2020 7:05 AM MRN: [id number] Endoscopist: Thornton Park MD, MD Age: 64 Referring MD:  Date of Birth: 1956-11-07 Gender: Male Account #: [id number] Procedure:                Colonoscopy Indications:              Screening for colorectal malignant neoplasm                           Normal colonoscopy at [facility] approximately 12                            years ago                           No known family history of colon cancer or polyps Medicines:                Monitored Anesthesia Care Procedure:                Pre-Anesthesia Assessment:                           - Prior to the procedure, a History and Physical                            was performed, and patient medications and                            allergies were reviewed. The patient's tolerance of                            previous anesthesia was also reviewed. The risks                            and benefits of the procedure and the sedation                            options and risks were discussed with the patient.                            All questions were answered, and informed consent                            was obtained. Prior Anticoagulants: The patient has                            taken no previous anticoagulant or antiplatelet                            agents. ASA Grade Assessment: II - A patient with                            mild systemic disease. After reviewing the risks  and benefits, the patient was deemed in                            satisfactory condition to undergo the procedure.                           After obtaining informed consent, the colonoscope                            was passed under direct vision. Throughout the                            procedure, the patient's blood pressure, pulse, and                            oxygen saturations were monitored continuously. The                      Colonoscope was introduced through the anus and                            advanced to the 3 cm into the ileum. A second                            forward view of the right colon was performed. The                            colonoscopy was performed without difficulty. The                            patient tolerated the procedure well. The quality                            of the bowel preparation was good. The terminal                            ileum, ileocecal valve, appendiceal orifice, and                            rectum were photographed. Scope In: 8:05:46 AM Scope Out: 8:15:40 AM Scope Withdrawal Time: 0 hours 8 minutes 14 seconds  Total Procedure Duration: 0 hours 9 minutes 54 seconds  Findings:                 The perianal and digital rectal examinations were                            normal.                           Multiple small and large-mouthed diverticula were                            found in the sigmoid colon and descending colon.  Non-bleeding internal hemorrhoids were found.                           The exam was otherwise without abnormality on                            direct and retroflexion views. Complications:            No immediate complications. Estimated Blood Loss:     Estimated blood loss: none. Impression:               - Diverticulosis in the sigmoid colon and in the                            descending colon.                           - Non-bleeding internal hemorrhoids.                           - The examination was otherwise normal on direct                            and retroflexion views.                           - No specimens collected. Recommendation:           - Patient has a contact number available for                            emergencies. The signs and symptoms of potential                            delayed complications were discussed with the                            patient.  Return to normal activities tomorrow.                            Written discharge instructions were provided to the                            patient.                           - Resume previous diet.                           - Continue present medications.                           - Repeat colonoscopy in 10 years for screening                            purposes, earlier with any new symptoms.                           -  Follow a high fiber diet. Drink at least 64                            ounces of water daily. Add a daily stool bulking                            agent such as psyllium (an exampled would be                            Metamucil).                           - Emerging evidence supports eating a diet of                            fruits, vegetables, grains, calcium, and yogurt                            while reducing red meat and alcohol may reduce the                            risk of colon cancer.                           - Thank you for allowing me to be involved in your                            colon cancer prevention. Thornton Park MD, MD 03/23/2020 8:20:16 AM This report has been signed electronically.

## 2020-03-23 NOTE — Patient Instructions (Signed)
Please read handouts provided. Continue present medications. Follow a high fiber diet. Drink at least 64 ounces of water daily. Add a daily stool bulking agent such as psyllium ( Metamucil ).      YOU HAD AN ENDOSCOPIC PROCEDURE TODAY AT [facility]:   Refer to the procedure report that was given to you for any specific questions about what was found during the examination.  If the procedure report does not answer your questions, please call your gastroenterologist to clarify.  If you requested that your care partner not be given the details of your procedure findings, then the procedure report has been included in a sealed envelope for you to review at your convenience later.  YOU SHOULD EXPECT: Some feelings of bloating in the abdomen. Passage of more gas than usual.  Walking can help get rid of the air that was put into your GI tract during the procedure and reduce the bloating. If you had a lower endoscopy (such as a colonoscopy or flexible sigmoidoscopy) you may notice spotting of blood in your stool or on the toilet paper. If you underwent a bowel prep for your procedure, you may not have a normal bowel movement for a few days.  Please Note:  You might notice some irritation and congestion in your nose or some drainage.  This is from the oxygen used during your procedure.  There is no need for concern and it should clear up in a day or so.  SYMPTOMS TO REPORT IMMEDIATELY:   Following lower endoscopy (colonoscopy or flexible sigmoidoscopy):  Excessive amounts of blood in the stool  Significant tenderness or worsening of abdominal pains  Swelling of the abdomen that is new, acute  Fever of 100F or higher   For urgent or emergent issues, a gastroenterologist can be reached at any hour by calling [phone]. Do not use MyChart messaging for urgent concerns.    DIET:  We do recommend a small meal at first, but then you may proceed to your regular diet.  Drink plenty  of fluids but you should avoid alcoholic beverages for 24 hours.  ACTIVITY:  You should plan to take it easy for the rest of today and you should NOT DRIVE or use heavy machinery until tomorrow (because of the sedation medicines used during the test).    FOLLOW UP: Our staff will call the number listed on your records 48-72 hours following your procedure to check on you and address any questions or concerns that you may have regarding the information given to you following your procedure. If we do not reach you, we will leave a message.  We will attempt to reach you two times.  During this call, we will ask if you have developed any symptoms of COVID 19. If you develop any symptoms (ie: fever, flu-like symptoms, shortness of breath, cough etc.) before then, please call [phone].  If you test positive for Covid 19 in the 2 weeks post procedure, please call and report this information to [place].    If any biopsies were taken you will be contacted by phone or by letter within the next 1-3 weeks.  Please call us at [phone] if you have not heard about the biopsies in 3 weeks.    SIGNATURES/CONFIDENTIALITY: You and/or your care partner have signed paperwork which will be entered into your electronic medical record.  These signatures attest to the fact that that the information above on your After Visit Summary has been reviewed  and is understood.  Full responsibility of the confidentiality of this discharge information lies with you and/or your care-partner. 

## 2020-03-23 NOTE — Progress Notes (Signed)
Temp-JB VS-CW  Pt's states no medical or surgical changes since previsit or office visit.  

## 2020-03-25 ENCOUNTER — Telehealth: Payer: Self-pay

## 2020-03-25 ENCOUNTER — Telehealth: Payer: Self-pay | Admitting: *Deleted

## 2020-03-25 NOTE — Telephone Encounter (Signed)
  Follow up Call-  Call back number 03/23/2020  Post procedure Call Back phone  # [phone]  Permission to leave phone message Yes  Some recent data might be hidden     Patient questions:  Message left to call us if necessary.  Second call.

## 2020-03-25 NOTE — Telephone Encounter (Signed)
First post procedure follow up call, no answer 

## 2020-08-09 ENCOUNTER — Other Ambulatory Visit: Payer: Self-pay | Admitting: Family Medicine

## 2020-08-09 DIAGNOSIS — Z114 Encounter for screening for human immunodeficiency virus [HIV]: Secondary | ICD-10-CM

## 2020-08-09 DIAGNOSIS — Z1159 Encounter for screening for other viral diseases: Secondary | ICD-10-CM

## 2020-08-09 DIAGNOSIS — E7800 Pure hypercholesterolemia, unspecified: Secondary | ICD-10-CM

## 2020-08-09 DIAGNOSIS — M1611 Unilateral primary osteoarthritis, right hip: Secondary | ICD-10-CM

## 2020-08-09 DIAGNOSIS — R7303 Prediabetes: Secondary | ICD-10-CM

## 2020-08-16 ENCOUNTER — Other Ambulatory Visit (INDEPENDENT_AMBULATORY_CARE_PROVIDER_SITE_OTHER): Payer: 59

## 2020-08-16 ENCOUNTER — Other Ambulatory Visit: Payer: Self-pay

## 2020-08-16 DIAGNOSIS — E7800 Pure hypercholesterolemia, unspecified: Secondary | ICD-10-CM | POA: Diagnosis not present

## 2020-08-16 DIAGNOSIS — M1611 Unilateral primary osteoarthritis, right hip: Secondary | ICD-10-CM

## 2020-08-16 DIAGNOSIS — Z114 Encounter for screening for human immunodeficiency virus [HIV]: Secondary | ICD-10-CM

## 2020-08-16 DIAGNOSIS — Z1159 Encounter for screening for other viral diseases: Secondary | ICD-10-CM

## 2020-08-16 DIAGNOSIS — R7303 Prediabetes: Secondary | ICD-10-CM

## 2020-08-16 LAB — LIPID PANEL
Cholesterol: 222 mg/dL — ABNORMAL HIGH (ref 0–200)
HDL: 38.8 mg/dL — ABNORMAL LOW (ref >39.00)
LDL Cholesterol: 156 mg/dL — ABNORMAL HIGH (ref 0–99)
NonHDL: 182.82
Total CHOL/HDL Ratio: 6
Triglycerides: 136 mg/dL (ref 0.0–149.0)
VLDL: 27.2 mg/dL (ref 0.0–40.0)

## 2020-08-16 LAB — VITAMIN D 25 HYDROXY (VIT D DEFICIENCY, FRACTURES): VITD: 20.57 ng/mL — ABNORMAL LOW (ref 30.00–100.00)

## 2020-08-16 LAB — TSH: TSH: 3.4 u[IU]/mL (ref 0.35–4.50)

## 2020-08-16 LAB — HEMOGLOBIN A1C: Hgb A1c MFr Bld: 6 % (ref 4.6–6.5)

## 2020-08-18 LAB — HEPATITIS C ANTIBODY
Hepatitis C Ab: NONREACTIVE
SIGNAL TO CUT-OFF: 0.03 (ref <1.00)

## 2020-08-18 LAB — HIV ANTIBODY (ROUTINE TESTING W REFLEX): HIV 1&2 Ab, 4th Generation: NONREACTIVE

## 2020-08-19 ENCOUNTER — Encounter: Payer: Self-pay | Admitting: Family Medicine

## 2020-08-19 ENCOUNTER — Ambulatory Visit (INDEPENDENT_AMBULATORY_CARE_PROVIDER_SITE_OTHER): Payer: 59 | Admitting: Family Medicine

## 2020-08-19 ENCOUNTER — Other Ambulatory Visit: Payer: Self-pay

## 2020-08-19 VITALS — BP 142/80 | HR 67 | Temp 97.3°F | Ht 67.75 in | Wt 192.6 lb

## 2020-08-19 DIAGNOSIS — R7303 Prediabetes: Secondary | ICD-10-CM | POA: Insufficient documentation

## 2020-08-19 DIAGNOSIS — M1611 Unilateral primary osteoarthritis, right hip: Secondary | ICD-10-CM

## 2020-08-19 DIAGNOSIS — Z0000 Encounter for general adult medical examination without abnormal findings: Secondary | ICD-10-CM

## 2020-08-19 DIAGNOSIS — L989 Disorder of the skin and subcutaneous tissue, unspecified: Secondary | ICD-10-CM

## 2020-08-19 DIAGNOSIS — E7800 Pure hypercholesterolemia, unspecified: Secondary | ICD-10-CM | POA: Diagnosis not present

## 2020-08-19 MED ORDER — ROSUVASTATIN CALCIUM 10 MG PO TABS: 10.0000 mg | ORAL_TABLET | Freq: Every day | ORAL | 3 refills | Status: AC

## 2020-08-19 NOTE — Progress Notes (Signed)
Subjective:    Patient ID: Rodney Dougherty, male    DOB: [birth year], 64 y.o.   MRN: [id number]  HPI Chief Complaint  Patient presents with  . Annual Exam   This is a 64 yo male who presents today for annual exam.     Last CPE- 2 years ago PSA- 3/21 Colonoscopy- 03/23/2020, 10 year recall Tdap- 02/17/2020 Covid- not planning on getting it Flu- declines Dental- dentures Eye- not for several years, sees Dr. Marica Dougherty Exercise- very physical job  Prediabetes- eats a lot of bread. Has been watching portions, lost a few pounds. Has cut down on fast food. Drinks sweet tea.   Hyperlipidemia- was started on rosuvastatin after visit with me 6 months ago. Did not get filled. Worried about going on medication and "never coming off."  The 10-year ASCVD risk score Rodney Bussing DC Jr., et al., 2013) is: 15.4%   Values used to calculate the score:     Age: 28 years     Sex: Male     Is Non-Hispanic African American: No     Diabetic: No     Tobacco smoker: No     Systolic Blood Pressure: 287 mmHg     Is BP treated: No     HDL Cholesterol: 38.8 mg/dL     Total Cholesterol: 222 mg/dL  Right hip pain- needs replacement, rarely takes anything, doesn't want steroid injections, slows him down in his work but does not interfere with sleep, activities.   Review of Systems  Constitutional: Negative.   HENT: Negative.   Eyes: Negative.   Respiratory: Negative.   Cardiovascular: Negative.   Endocrine: Negative.   Genitourinary: Negative.   Musculoskeletal:       Right hip pain per HPI.   Skin:       4 places on face that bleed easily.   Allergic/Immunologic: Negative.   Neurological: Negative.   Hematological: Negative.   Psychiatric/Behavioral: Negative.        Objective:   Physical Exam Physical Exam  Constitutional: He is oriented to person, place, and time. He appears well-developed and well-nourished.  HENT:  Head: Normocephalic and atraumatic.  Right Ear: External ear normal.  TM normal.  Left Ear: External ear normal.  TM normal.  Nose: Nose normal.  Mouth/Throat: Oropharynx is clear and moist. Upper/ lower dentures Eyes: Conjunctivae are normal.  Neck: Normal range of motion. Neck supple.  Cardiovascular: Normal rate, regular rhythm, normal heart sounds and intact distal pulses.   Pulmonary/Chest: Effort normal and breath sounds normal.  Abdominal: Soft. Bowel sounds are normal. Hernia confirmed negative in the right inguinal area and confirmed negative in the left inguinal area.  Genitourinary: Testes normal and penis normal. Circumcised.  Musculoskeletal: Normal range of motion. He exhibits no edema or tenderness.       Cervical back: Normal.       Thoracic back: Normal.       Lumbar back: Normal.  Lymphadenopathy:    He has no cervical adenopathy.       Right: No inguinal adenopathy present.       Left: No inguinal adenopathy present.  Neurological: He is alert and oriented to person, place, and time.  Skin: Skin is warm and dry. Three hyperpigmented, reddish papules along right side of lower face, one on underside of chin.  Psychiatric: He has a normal mood and affect. His behavior is normal. Judgment normal.  Vitals reviewed.     BP (!) 142/80   Pulse  67   Temp (!) 97.3 F (36.3 C) (Temporal)   Ht 5' 7.75" (1.721 m)   Wt 192 lb 9 oz (87.3 kg)   SpO2 95%   BMI 29.50 kg/m  Wt Readings from Last 3 Encounters:  08/19/20 192 lb 9 oz (87.3 kg)  03/23/20 196 lb 6.4 oz (89.1 kg)  03/09/20 196 lb 6.4 oz (89.1 kg)   Depression screen [facility] 2/9 08/19/2020 02/17/2020  Decreased Interest 0 0  Down, Depressed, Hopeless 0 0  PHQ - 2 Score 0 0   EKG- sinus brady, rate 55, PR 158, QT 440. No change compared to prior tracing.     Assessment & Plan:  1. Annual physical exam - Discussed and encouraged healthy lifestyle choices- adequate sleep, regular exercise, stress management and healthy food choices.    2. Prediabetes - encouraged continued  improvement of diet, decrease sugars and starches, increase vegetables - EKG 12-Lead - follow up in 6 months  3. Hypercholesteremia - discussed his ASCVD score, dietary changes and patient agreed to take rosuvastatin - rosuvastatin (CRESTOR) 10 MG tablet; Take 1 tablet (10 mg total) by mouth daily.  Dispense: 90 tablet; Refill: 3 - EKG 12-Lead  4. Lesion of skin of face - Ambulatory referral to Dermatology  5. Primary osteoarthritis of right hip - discussed hip replacement surgery, he is going to contact ortho to discuss doing this fall  This visit occurred during the SARS-CoV-2 public health emergency.  Safety protocols were in place, including screening questions prior to the visit, additional usage of staff PPE, and extensive cleaning of exam room while observing appropriate contact time as indicated for disinfecting solutions.    Rodney Reamer, FNP-BC  [facility]  08/19/2020 1:29 PM

## 2020-08-19 NOTE — Patient Instructions (Signed)
Good to see you today  PLease get your eye exam  Follow up in 6 months  You have prediabetes- continue to work on your diet, decrease breads/ starches/ sweets/ drinks with sugar (soda, juice, sweet tea)  Start your Crestor (rosuvastatin)

## 2020-10-11 ENCOUNTER — Ambulatory Visit (INDEPENDENT_AMBULATORY_CARE_PROVIDER_SITE_OTHER): Payer: 59 | Admitting: Orthopaedic Surgery

## 2020-10-11 ENCOUNTER — Encounter: Payer: Self-pay | Admitting: Orthopaedic Surgery

## 2020-10-11 ENCOUNTER — Ambulatory Visit (INDEPENDENT_AMBULATORY_CARE_PROVIDER_SITE_OTHER): Payer: 59

## 2020-10-11 ENCOUNTER — Other Ambulatory Visit: Payer: Self-pay

## 2020-10-11 VITALS — Ht 68.0 in | Wt 195.0 lb

## 2020-10-11 DIAGNOSIS — M1611 Unilateral primary osteoarthritis, right hip: Secondary | ICD-10-CM

## 2020-10-11 NOTE — Progress Notes (Addendum)
Office Visit Note   Patient: Rodney Dougherty           Date of Birth: 12/10/1955           MRN: [id number] Visit Date: 10/11/2020              Requested by: Elby Beck, [place],  [place] [address] PCP: Elby Beck, FNP   Assessment & Plan: Visit Diagnoses:  1. Primary osteoarthritis of right hip     Plan: Impression is end-stage right hip DJD.  He has failed NSAIDs for greater than 3 months at this point.  He continues to have severe pain and significant limitation with ADLs and he is fearful of falling due to the hip pain.  At this point after consideration of his options patient has decided to proceed with a right total hip replacement as he understands medications and injections only provide temporary relief.  Risk benefits rehab recovery reviewed with the patient in detail.  All questions answered to his satisfaction.  He denies history of DVT.  Follow-Up Instructions: Return for 2-week postop visit.   Orders:  Orders Placed This Encounter  Procedures  . XR Pelvis 1-2 Views   No orders of the defined types were placed in this encounter.     Procedures: No procedures performed   Clinical Data: No additional findings.   Subjective: Chief Complaint  Patient presents with  . Right Hip - Pain    Rodney Dougherty comes in today for follow-up of chronic right hip pain due to DJD.  He is has severe pain at all times and is affecting his quality of life and ability to work and he has inability to perform ADLs.  He has nighttime pain as well.  Denies any numbness and tingling.  He has done a lot of thinking and has decided that he is interested in hip replacement.  Since our last visit about 6 months ago he has been taking over-the-counter NSAIDs on a daily basis without significant relief.   Review of Systems  Constitutional: Negative.   All other systems reviewed and are negative.    Objective: Vital Signs: Ht 5\' 8"  (1.727 m)   Wt 195 lb  (88.5 kg)   BMI 29.65 kg/m   Physical Exam Vitals and nursing note reviewed.  Constitutional:      Appearance: He is well-developed.  Pulmonary:     Effort: Pulmonary effort is normal.  Abdominal:     Palpations: Abdomen is soft.  Skin:    General: Skin is warm.  Neurological:     Mental Status: He is alert and oriented to person, place, and time.  Psychiatric:        Behavior: Behavior normal.        Thought Content: Thought content normal.        Judgment: Judgment normal.     Ortho Exam Right hip exam shows moderate pain with severe limitation in range of motion.  Strength is decreased secondary to pain.  Specialty Comments:  No specialty comments available.  Imaging: XR Pelvis 1-2 Views  Result Date: 10/11/2020 Advanced right hip degenerative joint disease with complete joint space narrowing.    PMFS History: Patient Active Problem List   Diagnosis Date Noted  . Prediabetes 08/19/2020  . Hypercholesteremia 08/19/2020  . Primary osteoarthritis of right hip 02/25/2020   Past Medical History:  Diagnosis Date  . Hyperlipidemia     Family History  Problem Relation Age  of Onset  . Stroke Mother   . Colon cancer Neg Hx   . Colon polyps Neg Hx   . Esophageal cancer Neg Hx   . Stomach cancer Neg Hx   . Rectal cancer Neg Hx     Past Surgical History:  Procedure Laterality Date  . BACK SURGERY     pt. reports it was about 20 yrs. ago, no problems since then    . HAND TENDON SURGERY Left 1999   occupational injury involving artery as well  . HERNIA REPAIR Left 1975   inguinal   . INGUINAL HERNIA REPAIR Left 04/16/2017   Procedure: LAPAROSCOPIC LEFT INGUINAL HERNIA REPAIR;  Surgeon: Clovis Riley, MD;  Location: [facility];  Service: General;  Laterality: Left;  . INSERTION OF MESH Left 04/16/2017   Procedure: INSERTION OF MESH;  Surgeon: Clovis Riley, MD;  [facility];  Service: General;  Laterality: Left;   Social History   Occupational History   . Occupation: Full time  Tobacco Use  . Smoking status: Never Smoker  . Smokeless tobacco: Never Used  Vaping Use  . Vaping Use: Never used  Substance and Sexual Activity  . Alcohol use: Yes    Comment: rarely  . Drug use: No  . Sexual activity: Not on file

## 2020-11-07 ENCOUNTER — Other Ambulatory Visit: Payer: Self-pay | Admitting: Physician Assistant

## 2020-11-07 ENCOUNTER — Ambulatory Visit: Payer: 59

## 2020-11-07 ENCOUNTER — Other Ambulatory Visit: Payer: Self-pay

## 2020-11-07 DIAGNOSIS — M1611 Unilateral primary osteoarthritis, right hip: Secondary | ICD-10-CM

## 2020-11-07 MED ORDER — DOCUSATE SODIUM 100 MG PO CAPS: 100.0000 mg | ORAL_CAPSULE | Freq: Every day | ORAL | 2 refills | Status: DC | PRN

## 2020-11-07 MED ORDER — OXYCODONE-ACETAMINOPHEN 5-325 MG PO TABS
1.0000 | ORAL_TABLET | Freq: Four times a day (QID) | ORAL | 0 refills | Status: DC | PRN
Start: 2020-11-07 — End: 2021-03-06

## 2020-11-07 MED ORDER — ASPIRIN EC 81 MG PO TBEC: 81.0000 mg | DELAYED_RELEASE_TABLET | Freq: Two times a day (BID) | ORAL | 0 refills | Status: DC

## 2020-11-07 MED ORDER — ONDANSETRON HCL 4 MG PO TABS: 4.0000 mg | ORAL_TABLET | Freq: Three times a day (TID) | ORAL | 0 refills | Status: DC | PRN

## 2020-11-07 MED ORDER — METHOCARBAMOL 500 MG PO TABS: 500.0000 mg | ORAL_TABLET | Freq: Two times a day (BID) | ORAL | 0 refills | Status: DC | PRN

## 2020-11-08 LAB — EXTRA LAV TOP TUBE

## 2020-11-08 LAB — PREALBUMIN: Prealbumin: 27 mg/dL (ref 21–43)

## 2020-11-10 ENCOUNTER — Other Ambulatory Visit (HOSPITAL_COMMUNITY)
Admission: RE | Admit: 2020-11-10 | Discharge: 2020-11-10 | Disposition: A | Discharge status: Home / Self Care | Payer: 59 | Source: Ambulatory Visit | Attending: Orthopaedic Surgery | Admitting: Orthopaedic Surgery

## 2020-11-10 ENCOUNTER — Other Ambulatory Visit: Payer: Self-pay

## 2020-11-10 DIAGNOSIS — Z20822 Contact with and (suspected) exposure to covid-19: Secondary | ICD-10-CM | POA: Insufficient documentation

## 2020-11-10 DIAGNOSIS — Z01812 Encounter for preprocedural laboratory examination: Secondary | ICD-10-CM | POA: Diagnosis present

## 2020-11-10 LAB — SARS CORONAVIRUS 2 (TAT 6-24 HRS): SARS Coronavirus 2: NEGATIVE

## 2020-11-10 NOTE — Progress Notes (Addendum)
COVID Vaccine Completed:  No Date COVID Vaccine completed: COVID vaccine manufacturer: [place]   PCP - Clarene Reamer, FNP Cardiologist - N/A  Chest x-ray - 11-11-20 in Epic EKG - 08-19-20 in Epic Stress Test -  ECHO -  Cardiac Cath -  Pacemaker/ICD device last checked:  Sleep Study - 10+ years ago, +sleep apnea CPAP - No  Fasting Blood Sugar -  Checks Blood Sugar _____ times a day  Blood Thinner Instructions: Aspirin Instructions: Last Dose:  Anesthesia review:   Patient denies shortness of breath, fever, cough and chest pain at PAT appointment.  Pt able to climb a flight of stairs and perform ADLs independently.   Patient verbalized understanding of instructions that were given to them at the PAT appointment. Patient was also instructed that they will need to review over the PAT instructions again at home before surgery.

## 2020-11-10 NOTE — Patient Instructions (Addendum)
DUE TO COVID-19 ONLY ONE VISITOR IS ALLOWED TO COME WITH YOU AND STAY IN THE WAITING ROOM ONLY DURING PRE OP AND PROCEDURE.   IF YOU WILL BE ADMITTED INTO THE HOSPITAL YOU ARE ALLOWED ONE SUPPORT PERSON DURING VISITATION HOURS ONLY (10AM -8PM)   . The support person may change daily. . The support person must pass our screening, gel in and out, and wear a mask at all times, including in the patient's room. . Patients must also wear a mask when staff or their support person are in the room.        Your procedure is scheduled on:  Monday, 11-14-20   Report to [facility] Main  Entrance   Report to admitting at 10:00 AM   Call this number if you have problems the morning of surgery [phone]   Do not eat food :After Midnight.   May have liquids until 9:20 AM day of surgery  CLEAR LIQUID DIET  Foods Allowed                                                                     Foods Excluded  Water, Black Coffee and tea, regular and decaf              liquids that you cannot  Plain Jell-O in any flavor  (No red)                                     see through such as: Fruit ices (not with fruit pulp)                                      milk, soups, orange juice              Iced Popsicles (No red)                                      All solid food                                   Apple juices Sports drinks like Gatorade (No red) Lightly seasoned clear broth or consume(fat free) Sugar, honey syrup     Complete one G2 drink the morning of surgery at 9:20 AM the day of surgery.  Oral Hygiene is also important to reduce your risk of infection.                                    Remember - BRUSH YOUR TEETH THE MORNING OF SURGERY WITH YOUR REGULAR TOOTHPASTE   Do NOT smoke after Midnight   Take these medicines the morning of surgery with A SIP OF WATER:  None  You may not have any metal on your body including  jewelry, and body piercings              Do not wear lotions, powders, perfumes/cologne, or deodorant             Men may shave face and neck.   Do not bring valuables to the hospital. [place].   Contacts, dentures or bridgework may not be worn into surgery.   Bring small overnight bag day of surgery.                Please read over the following fact sheets you were given: IF YOU HAVE QUESTIONS ABOUT YOUR PRE OP INSTRUCTIONS PLEASE CALL [phone]   [facility] - Preparing for Surgery Before surgery, you can play an important role.  Because skin is not sterile, your skin needs to be as free of germs as possible.  You can reduce the number of germs on your skin by washing with CHG (chlorahexidine gluconate) soap before surgery.  CHG is an antiseptic cleaner which kills germs and bonds with the skin to continue killing germs even after washing. Please DO NOT use if you have an allergy to CHG or antibacterial soaps.  If your skin becomes reddened/irritated stop using the CHG and inform your nurse when you arrive at Short Stay. Do not shave (including legs and underarms) for at least 48 hours prior to the first CHG shower.  You may shave your face/neck.  Please follow these instructions carefully:  1.  Shower with CHG Soap the night before surgery and the  morning of surgery.  2.  If you choose to wash your hair, wash your hair first as usual with your normal  shampoo.  3.  After you shampoo, rinse your hair and body thoroughly to remove the shampoo.                             4.  Use CHG as you would any other liquid soap.  You can apply chg directly to the skin and wash.  Gently with a scrungie or clean washcloth.  5.  Apply the CHG Soap to your body ONLY FROM THE NECK DOWN.   Do   not use on face/ open                           Wound or open sores. Avoid contact with eyes, ears mouth and   genitals (private parts).                       Wash face,  Genitals (private parts) with  your normal soap.             6.  Wash thoroughly, paying special attention to the area where your    surgery  will be performed.  7.  Thoroughly rinse your body with warm water from the neck down.  8.  DO NOT shower/wash with your normal soap after using and rinsing off the CHG Soap.                9.  Pat yourself dry with a clean towel.            10.  Wear clean pajamas.            11.  Place clean sheets  on your bed the night of your first shower and do not  sleep with pets. Day of Surgery : Do not apply any lotions/deodorants the morning of surgery.  Please wear clean clothes to the hospital/surgery center.  FAILURE TO FOLLOW THESE INSTRUCTIONS MAY RESULT IN THE CANCELLATION OF YOUR SURGERY  PATIENT SIGNATURE_________________________________  NURSE SIGNATURE__________________________________  ________________________________________________________________________   Rodney Dougherty  An incentive spirometer is a tool that can help keep your lungs clear and active. This tool measures how well you are filling your lungs with each breath. Taking long deep breaths may help reverse or decrease the chance of developing breathing (pulmonary) problems (especially infection) following:  A long period of time when you are unable to move or be active. BEFORE THE PROCEDURE   If the spirometer includes an indicator to show your best effort, your nurse or respiratory therapist will set it to a desired goal.  If possible, sit up straight or lean slightly forward. Try not to slouch.  Hold the incentive spirometer in an upright position. INSTRUCTIONS FOR USE  1. Sit on the edge of your bed if possible, or sit up as far as you can in bed or on a chair. 2. Hold the incentive spirometer in an upright position. 3. Breathe out normally. 4. Place the mouthpiece in your mouth and seal your lips tightly around it. 5. Breathe in slowly and as deeply as possible, raising the piston or the ball  toward the top of the column. 6. Hold your breath for 3-5 seconds or for as long as possible. Allow the piston or ball to fall to the bottom of the column. 7. Remove the mouthpiece from your mouth and breathe out normally. 8. Rest for a few seconds and repeat Steps 1 through 7 at least 10 times every 1-2 hours when you are awake. Take your time and take a few normal breaths between deep breaths. 9. The spirometer may include an indicator to show your best effort. Use the indicator as a goal to work toward during each repetition. 10. After each set of 10 deep breaths, practice coughing to be sure your lungs are clear. If you have an incision (the cut made at the time of surgery), support your incision when coughing by placing a pillow or rolled up towels firmly against it. Once you are able to get out of bed, walk around indoors and cough well. You may stop using the incentive spirometer when instructed by your caregiver.  RISKS AND COMPLICATIONS  Take your time so you do not get dizzy or light-headed.  If you are in pain, you may need to take or ask for pain medication before doing incentive spirometry. It is harder to take a deep breath if you are having pain. AFTER USE  Rest and breathe slowly and easily.  It can be helpful to keep track of a log of your progress. Your caregiver can provide you with a simple table to help with this. If you are using the spirometer at home, follow these instructions: [place] IF:   You are having difficultly using the spirometer.  You have trouble using the spirometer as often as instructed.  Your pain medication is not giving enough relief while using the spirometer.  You develop fever of 100.5 F (38.1 C) or higher. SEEK IMMEDIATE MEDICAL CARE IF:   You cough up bloody sputum that had not been present before.  You develop fever of 102 F (38.9 C) or greater.  You develop  worsening pain at or near the incision site. MAKE SURE YOU:    Understand these instructions.  Will watch your condition.  Will get help right away if you are not doing well or get worse. Document Released: 04/01/2007 Document Revised: 02/11/2012 Document Reviewed: 06/02/2007 Exit[employer] Patient Information 2014 [employer], [place].   ________________________________________________________________________  WHAT IS A BLOOD TRANSFUSION? Blood Transfusion Information  A transfusion is the replacement of blood or some of its parts. Blood is made up of multiple cells which provide different functions.  Red blood cells carry oxygen and are used for blood loss replacement.  White blood cells fight against infection.  Platelets control bleeding.  Plasma helps clot blood.  Other blood products are available for specialized needs, such as hemophilia or other clotting disorders. BEFORE THE TRANSFUSION  Who gives blood for transfusions?   Healthy volunteers who are fully evaluated to make sure their blood is safe. This is blood bank blood. Transfusion therapy is the safest it has ever been in the practice of medicine. Before blood is taken from a donor, a complete history is taken to make sure that person has no history of diseases nor engages in risky social behavior (examples are intravenous drug use or sexual activity with multiple partners). The donor's travel history is screened to minimize risk of transmitting infections, such as malaria. The donated blood is tested for signs of infectious diseases, such as HIV and hepatitis. The blood is then tested to be sure it is compatible with you in order to minimize the chance of a transfusion reaction. If you or a relative donates blood, this is often done in anticipation of surgery and is not appropriate for emergency situations. It takes many days to process the donated blood. RISKS AND COMPLICATIONS Although transfusion therapy is very safe and saves many lives, the main dangers of transfusion include:    Getting an infectious disease.  Developing a transfusion reaction. This is an allergic reaction to something in the blood you were given. Every precaution is taken to prevent this. The decision to have a blood transfusion has been considered carefully by your caregiver before blood is given. Blood is not given unless the benefits outweigh the risks. AFTER THE TRANSFUSION  Right after receiving a blood transfusion, you will usually feel much better and more energetic. This is especially true if your red blood cells have gotten low (anemic). The transfusion raises the level of the red blood cells which carry oxygen, and this usually causes an energy increase.  The nurse administering the transfusion will monitor you carefully for complications. HOME CARE INSTRUCTIONS  No special instructions are needed after a transfusion. You may find your energy is better. Speak with your caregiver about any limitations on activity for underlying diseases you may have. SEEK MEDICAL CARE IF:   Your condition is not improving after your transfusion.  You develop redness or irritation at the intravenous (IV) site. SEEK IMMEDIATE MEDICAL CARE IF:  Any of the following symptoms occur over the next 12 hours:  Shaking chills.  You have a temperature by mouth above 102 F (38.9 C), not controlled by medicine.  Chest, back, or muscle pain.  People around you feel you are not acting correctly or are confused.  Shortness of breath or difficulty breathing.  Dizziness and fainting.  You get a rash or develop hives.  You have a decrease in urine output.  Your urine turns a dark color or changes to pink, red, or brown. Any of the  following symptoms occur over the next 10 days:  You have a temperature by mouth above 102 F (38.9 C), not controlled by medicine.  Shortness of breath.  Weakness after normal activity.  The white part of the eye turns yellow (jaundice).  You have a decrease in the  amount of urine or are urinating less often.  Your urine turns a dark color or changes to pink, red, or brown. Document Released: 11/16/2000 Document Revised: 02/11/2012 Document Reviewed: 07/05/2008 [facility] Patient Information 2014 [place], [place].  _______________________________________________________________________

## 2020-11-11 ENCOUNTER — Other Ambulatory Visit: Payer: Self-pay

## 2020-11-11 ENCOUNTER — Encounter (HOSPITAL_COMMUNITY)
Admission: RE | Admit: 2020-11-11 | Discharge: 2020-11-11 | Disposition: A | Discharge status: Home / Self Care | Payer: 59 | Source: Ambulatory Visit | Attending: Orthopaedic Surgery | Admitting: Orthopaedic Surgery

## 2020-11-11 ENCOUNTER — Encounter (HOSPITAL_COMMUNITY): Payer: Self-pay

## 2020-11-11 ENCOUNTER — Ambulatory Visit (HOSPITAL_COMMUNITY)
Admission: RE | Admit: 2020-11-11 | Discharge: 2020-11-11 | Disposition: A | Discharge status: Home / Self Care | Payer: 59 | Source: Ambulatory Visit | Attending: Physician Assistant | Admitting: Physician Assistant

## 2020-11-11 DIAGNOSIS — M1611 Unilateral primary osteoarthritis, right hip: Secondary | ICD-10-CM

## 2020-11-11 HISTORY — DX: Unspecified malignant neoplasm of skin, unspecified: C44.90

## 2020-11-11 HISTORY — DX: Sleep apnea, unspecified: G47.30

## 2020-11-11 HISTORY — DX: Unspecified osteoarthritis, unspecified site: M19.90

## 2020-11-11 HISTORY — DX: Prediabetes: R73.03

## 2020-11-11 LAB — CBC WITH DIFFERENTIAL/PLATELET
Abs Immature Granulocytes: 0.01 10*3/uL (ref 0.00–0.07)
Basophils Absolute: 0.1 10*3/uL (ref 0.0–0.1)
Basophils Relative: 1 %
Eosinophils Absolute: 0.3 10*3/uL (ref 0.0–0.5)
Eosinophils Relative: 4 %
HCT: 45.1 % (ref 39.0–52.0)
Hemoglobin: 14.9 g/dL (ref 13.0–17.0)
Immature Granulocytes: 0 %
Lymphocytes Relative: 24 %
Lymphs Abs: 1.4 10*3/uL (ref 0.7–4.0)
MCH: 28.2 pg (ref 26.0–34.0)
MCHC: 33 g/dL (ref 30.0–36.0)
MCV: 85.3 fL (ref 80.0–100.0)
Monocytes Absolute: 0.5 10*3/uL (ref 0.1–1.0)
Monocytes Relative: 9 %
Neutro Abs: 3.5 10*3/uL (ref 1.7–7.7)
Neutrophils Relative %: 62 %
Platelets: 247 10*3/uL (ref 150–400)
RBC: 5.29 MIL/uL (ref 4.22–5.81)
RDW: 13.3 % (ref 11.5–15.5)
WBC: 5.7 10*3/uL (ref 4.0–10.5)
nRBC: 0 % (ref 0.0–0.2)

## 2020-11-11 LAB — APTT: aPTT: 29 seconds (ref 24–36)

## 2020-11-11 LAB — SURGICAL PCR SCREEN
MRSA, PCR: NEGATIVE
Staphylococcus aureus: POSITIVE — AB

## 2020-11-11 LAB — URINALYSIS, ROUTINE W REFLEX MICROSCOPIC
Bacteria, UA: NONE SEEN
Bilirubin Urine: NEGATIVE
Glucose, UA: NEGATIVE mg/dL
Ketones, ur: NEGATIVE mg/dL
Leukocytes,Ua: NEGATIVE
Nitrite: NEGATIVE
Protein, ur: NEGATIVE mg/dL
Specific Gravity, Urine: 1.017 (ref 1.005–1.030)
pH: 5 (ref 5.0–8.0)

## 2020-11-11 LAB — COMPREHENSIVE METABOLIC PANEL
ALT: 22 U/L (ref 0–44)
AST: 20 U/L (ref 15–41)
Albumin: 4.1 g/dL (ref 3.5–5.0)
Alkaline Phosphatase: 86 U/L (ref 38–126)
Anion gap: 5 (ref 5–15)
BUN: 16 mg/dL (ref 8–23)
CO2: 27 mmol/L (ref 22–32)
Calcium: 8.9 mg/dL (ref 8.9–10.3)
Chloride: 107 mmol/L (ref 98–111)
Creatinine, Ser: 0.92 mg/dL (ref 0.61–1.24)
GFR, Estimated: >60 mL/min (ref >60)
Glucose, Bld: 111 mg/dL — ABNORMAL HIGH (ref 70–99)
Potassium: 4.5 mmol/L (ref 3.5–5.1)
Sodium: 139 mmol/L (ref 135–145)
Total Bilirubin: 0.5 mg/dL (ref 0.3–1.2)
Total Protein: 7.1 g/dL (ref 6.5–8.1)

## 2020-11-11 LAB — HEMOGLOBIN A1C
Hgb A1c MFr Bld: 5.8 % — ABNORMAL HIGH (ref 4.8–5.6)
Mean Plasma Glucose: 119.76 mg/dL

## 2020-11-11 LAB — PROTIME-INR
INR: 1 (ref 0.8–1.2)
Prothrombin Time: 13.2 seconds (ref 11.4–15.2)

## 2020-11-11 NOTE — Progress Notes (Signed)
PCR results sent to Dr. Erlinda Hong to review.

## 2020-11-13 MED ORDER — TRANEXAMIC ACID 1000 MG/10ML IV SOLN
2000.0000 mg | INTRAVENOUS | Status: DC
  Filled 2020-11-13: qty 20

## 2020-11-13 MED ORDER — BUPIVACAINE LIPOSOME 1.3 % IJ SUSP
10.0000 mL | Freq: Once | INTRAMUSCULAR | Status: DC
  Filled 2020-11-13: qty 10

## 2020-11-14 ENCOUNTER — Ambulatory Visit (HOSPITAL_COMMUNITY): Payer: 59

## 2020-11-14 ENCOUNTER — Ambulatory Visit (HOSPITAL_COMMUNITY): Payer: 59 | Admitting: Certified Registered Nurse Anesthetist

## 2020-11-14 ENCOUNTER — Ambulatory Visit (HOSPITAL_COMMUNITY): Admission: RE | Admit: 2020-11-14 | Payer: 59 | Source: Home / Self Care | Admitting: Orthopaedic Surgery

## 2020-11-14 ENCOUNTER — Encounter (HOSPITAL_COMMUNITY): Payer: Self-pay | Admitting: Orthopaedic Surgery

## 2020-11-14 ENCOUNTER — Encounter (HOSPITAL_COMMUNITY)
Admission: RE | Disposition: A | Discharge status: Home / Self Care | Payer: Self-pay | Source: Home / Self Care | Attending: Orthopaedic Surgery

## 2020-11-14 ENCOUNTER — Observation Stay (HOSPITAL_COMMUNITY)
Admission: RE | Admit: 2020-11-14 | Discharge: 2020-11-15 | Disposition: A | Discharge status: Home / Self Care | Payer: 59 | Attending: Orthopaedic Surgery | Admitting: Orthopaedic Surgery

## 2020-11-14 ENCOUNTER — Encounter (HOSPITAL_COMMUNITY): Admission: RE | Payer: Self-pay | Source: Home / Self Care

## 2020-11-14 ENCOUNTER — Observation Stay (HOSPITAL_COMMUNITY): Payer: 59

## 2020-11-14 ENCOUNTER — Other Ambulatory Visit: Payer: Self-pay

## 2020-11-14 DIAGNOSIS — M1611 Unilateral primary osteoarthritis, right hip: Secondary | ICD-10-CM | POA: Diagnosis not present

## 2020-11-14 DIAGNOSIS — Z7982 Long term (current) use of aspirin: Secondary | ICD-10-CM | POA: Diagnosis not present

## 2020-11-14 DIAGNOSIS — Z85828 Personal history of other malignant neoplasm of skin: Secondary | ICD-10-CM | POA: Insufficient documentation

## 2020-11-14 DIAGNOSIS — Z419 Encounter for procedure for purposes other than remedying health state, unspecified: Secondary | ICD-10-CM

## 2020-11-14 DIAGNOSIS — M25551 Pain in right hip: Secondary | ICD-10-CM | POA: Diagnosis present

## 2020-11-14 DIAGNOSIS — Z96649 Presence of unspecified artificial hip joint: Secondary | ICD-10-CM

## 2020-11-14 DIAGNOSIS — Z96641 Presence of right artificial hip joint: Secondary | ICD-10-CM

## 2020-11-14 HISTORY — PX: TOTAL HIP ARTHROPLASTY: SHX124

## 2020-11-14 LAB — TYPE AND SCREEN
ABO/RH(D): O POS
Antibody Screen: NEGATIVE

## 2020-11-14 LAB — ABO/RH: ABO/RH(D): O POS

## 2020-11-14 SURGERY — ARTHROPLASTY, HIP, TOTAL, ANTERIOR APPROACH
Anesthesia: Spinal | Site: Hip | Laterality: Right

## 2020-11-14 SURGERY — ARTHROPLASTY, HIP, TOTAL, ANTERIOR APPROACH
Anesthesia: General | Site: Hip | Laterality: Right

## 2020-11-14 MED ORDER — FENTANYL CITRATE (PF) 100 MCG/2ML IJ SOLN
INTRAMUSCULAR | Status: AC
  Filled 2020-11-14: qty 2

## 2020-11-14 MED ORDER — ONDANSETRON HCL 4 MG/2ML IJ SOLN: 4.0000 mg | Freq: Four times a day (QID) | INTRAMUSCULAR | Status: DC | PRN

## 2020-11-14 MED ORDER — BUPIVACAINE LIPOSOME 1.3 % IJ SUSP
10.0000 mL | Freq: Once | INTRAMUSCULAR | Status: AC
  Administered 2020-11-14: 12:00:00 10 mL
  Filled 2020-11-14: qty 10

## 2020-11-14 MED ORDER — PROPOFOL 10 MG/ML IV BOLUS
INTRAVENOUS | Status: DC | PRN
  Administered 2020-11-14: 150 mg via INTRAVENOUS

## 2020-11-14 MED ORDER — BUPIVACAINE HCL (PF) 0.25 % IJ SOLN
INTRAMUSCULAR | Status: AC
  Filled 2020-11-14: qty 30

## 2020-11-14 MED ORDER — SORBITOL 70 % SOLN
30.0000 mL | Freq: Every day | Status: DC | PRN
Start: 2020-11-14 — End: 2020-11-15
  Filled 2020-11-14: qty 30

## 2020-11-14 MED ORDER — DEXAMETHASONE SODIUM PHOSPHATE 10 MG/ML IJ SOLN
INTRAMUSCULAR | Status: AC
  Filled 2020-11-14: qty 1

## 2020-11-14 MED ORDER — ACETAMINOPHEN 10 MG/ML IV SOLN
INTRAVENOUS | Status: AC
  Filled 2020-11-14: qty 100

## 2020-11-14 MED ORDER — TRANEXAMIC ACID-NACL 1000-0.7 MG/100ML-% IV SOLN
1000.0000 mg | Freq: Once | INTRAVENOUS | Status: AC
  Administered 2020-11-14: 16:00:00 1000 mg via INTRAVENOUS
  Filled 2020-11-14: qty 100

## 2020-11-14 MED ORDER — POVIDONE-IODINE 10 % EX SWAB
2.0000 "application " | Freq: Once | CUTANEOUS | Status: AC
  Administered 2020-11-14: 2 via TOPICAL

## 2020-11-14 MED ORDER — ORAL CARE MOUTH RINSE: 15.0000 mL | Freq: Once | OROMUCOSAL | Status: AC

## 2020-11-14 MED ORDER — POLYETHYLENE GLYCOL 3350 17 G PO PACK: 17.0000 g | PACK | Freq: Every day | ORAL | Status: DC | PRN

## 2020-11-14 MED ORDER — SODIUM CHLORIDE 0.9 % IV SOLN: INTRAVENOUS | Status: DC

## 2020-11-14 MED ORDER — FENTANYL CITRATE (PF) 100 MCG/2ML IJ SOLN
INTRAMUSCULAR | Status: DC | PRN
  Administered 2020-11-14: 100 ug via INTRAVENOUS
  Administered 2020-11-14 (×2): 50 ug via INTRAVENOUS

## 2020-11-14 MED ORDER — DEXAMETHASONE SODIUM PHOSPHATE 10 MG/ML IJ SOLN: 10.0000 mg | Freq: Once | INTRAMUSCULAR | Status: DC

## 2020-11-14 MED ORDER — CEFAZOLIN SODIUM-DEXTROSE 2-4 GM/100ML-% IV SOLN
2.0000 g | INTRAVENOUS | Status: AC
  Administered 2020-11-14: 12:00:00 2 g via INTRAVENOUS
  Filled 2020-11-14: qty 100

## 2020-11-14 MED ORDER — PHENYLEPHRINE 40 MCG/ML (10ML) SYRINGE FOR IV PUSH (FOR BLOOD PRESSURE SUPPORT)
PREFILLED_SYRINGE | INTRAVENOUS | Status: DC | PRN
  Administered 2020-11-14: 40 ug via INTRAVENOUS
  Administered 2020-11-14: 80 ug via INTRAVENOUS
  Administered 2020-11-14 (×2): 40 ug via INTRAVENOUS

## 2020-11-14 MED ORDER — OXYCODONE HCL 5 MG PO TABS: 5.0000 mg | ORAL_TABLET | Freq: Once | ORAL | Status: DC | PRN

## 2020-11-14 MED ORDER — ACETAMINOPHEN 325 MG PO TABS
325.0000 mg | ORAL_TABLET | Freq: Four times a day (QID) | ORAL | Status: DC | PRN
Start: 2020-11-15 — End: 2020-11-15

## 2020-11-14 MED ORDER — PHENOL 1.4 % MT LIQD: 1.0000 | OROMUCOSAL | Status: DC | PRN

## 2020-11-14 MED ORDER — SUGAMMADEX SODIUM 200 MG/2ML IV SOLN
INTRAVENOUS | Status: DC | PRN
  Administered 2020-11-14: 200 mg via INTRAVENOUS

## 2020-11-14 MED ORDER — HYDROMORPHONE HCL 1 MG/ML IJ SOLN
0.5000 mg | INTRAMUSCULAR | Status: DC | PRN
  Administered 2020-11-14: 1 mg via INTRAVENOUS
  Filled 2020-11-14: qty 1

## 2020-11-14 MED ORDER — SODIUM CHLORIDE 0.9 % IR SOLN
Status: DC | PRN
  Administered 2020-11-14: 3000 mL

## 2020-11-14 MED ORDER — BUPIVACAINE HCL 0.25 % IJ SOLN
INTRAMUSCULAR | Status: DC | PRN
Start: 2020-11-14 — End: 2020-11-14
  Administered 2020-11-14: 20 mL

## 2020-11-14 MED ORDER — DIPHENHYDRAMINE HCL 12.5 MG/5ML PO ELIX: 25.0000 mg | ORAL_SOLUTION | ORAL | Status: DC | PRN

## 2020-11-14 MED ORDER — ALUM & MAG HYDROXIDE-SIMETH 200-200-20 MG/5ML PO SUSP: 30.0000 mL | ORAL | Status: DC | PRN

## 2020-11-14 MED ORDER — ASPIRIN 81 MG PO CHEW
81.0000 mg | CHEWABLE_TABLET | Freq: Two times a day (BID) | ORAL | Status: DC
  Administered 2020-11-14 – 2020-11-15 (×2): 81 mg via ORAL
  Filled 2020-11-14 (×2): qty 1

## 2020-11-14 MED ORDER — OXYCODONE HCL 5 MG PO TABS
5.0000 mg | ORAL_TABLET | ORAL | Status: DC | PRN
  Administered 2020-11-14: 10 mg via ORAL
  Filled 2020-11-14: qty 1
  Filled 2020-11-14: qty 2

## 2020-11-14 MED ORDER — VANCOMYCIN HCL 1000 MG IV SOLR
INTRAVENOUS | Status: AC
  Filled 2020-11-14: qty 1000

## 2020-11-14 MED ORDER — TRANEXAMIC ACID 1000 MG/10ML IV SOLN
INTRAVENOUS | Status: DC | PRN
  Administered 2020-11-14: 12:00:00 2000 mg via TOPICAL

## 2020-11-14 MED ORDER — ONDANSETRON HCL 4 MG/2ML IJ SOLN
INTRAMUSCULAR | Status: AC
  Filled 2020-11-14: qty 2

## 2020-11-14 MED ORDER — ONDANSETRON HCL 4 MG/2ML IJ SOLN
INTRAMUSCULAR | Status: DC | PRN
  Administered 2020-11-14: 4 mg via INTRAVENOUS

## 2020-11-14 MED ORDER — SODIUM CHLORIDE (PF) 0.9 % IJ SOLN
INTRAMUSCULAR | Status: AC
  Filled 2020-11-14: qty 20

## 2020-11-14 MED ORDER — OXYCODONE HCL 5 MG/5ML PO SOLN: 5.0000 mg | Freq: Once | ORAL | Status: DC | PRN

## 2020-11-14 MED ORDER — METHOCARBAMOL 500 MG IVPB - SIMPLE MED
500.0000 mg | Freq: Four times a day (QID) | INTRAVENOUS | Status: DC | PRN
  Filled 2020-11-14: qty 50

## 2020-11-14 MED ORDER — METOCLOPRAMIDE HCL 5 MG PO TABS: 5.0000 mg | ORAL_TABLET | Freq: Three times a day (TID) | ORAL | Status: DC | PRN

## 2020-11-14 MED ORDER — LACTATED RINGERS IV SOLN: INTRAVENOUS | Status: DC

## 2020-11-14 MED ORDER — LIDOCAINE 2% (20 MG/ML) 5 ML SYRINGE
INTRAMUSCULAR | Status: DC | PRN
  Administered 2020-11-14: 60 mg via INTRAVENOUS

## 2020-11-14 MED ORDER — DEXAMETHASONE SODIUM PHOSPHATE 10 MG/ML IJ SOLN
INTRAMUSCULAR | Status: DC | PRN
  Administered 2020-11-14: 4 mg via INTRAVENOUS

## 2020-11-14 MED ORDER — 0.9 % SODIUM CHLORIDE (POUR BTL) OPTIME
TOPICAL | Status: DC | PRN
  Administered 2020-11-14: 12:00:00 1000 mL

## 2020-11-14 MED ORDER — ACETAMINOPHEN 500 MG PO TABS
1000.0000 mg | ORAL_TABLET | Freq: Four times a day (QID) | ORAL | Status: DC
  Administered 2020-11-14 – 2020-11-15 (×3): 1000 mg via ORAL
  Filled 2020-11-14 (×3): qty 2

## 2020-11-14 MED ORDER — ACETAMINOPHEN 10 MG/ML IV SOLN
INTRAVENOUS | Status: DC | PRN
  Administered 2020-11-14: 1000 mg via INTRAVENOUS

## 2020-11-14 MED ORDER — MAGNESIUM CITRATE PO SOLN: 1.0000 | Freq: Once | ORAL | Status: DC | PRN

## 2020-11-14 MED ORDER — BUPIVACAINE LIPOSOME 1.3 % IJ SUSP
INTRAMUSCULAR | Status: DC | PRN
  Administered 2020-11-14: 10 mL

## 2020-11-14 MED ORDER — OXYCODONE HCL 5 MG PO TABS: 10.0000 mg | ORAL_TABLET | ORAL | Status: DC | PRN

## 2020-11-14 MED ORDER — DOCUSATE SODIUM 100 MG PO CAPS
100.0000 mg | ORAL_CAPSULE | Freq: Two times a day (BID) | ORAL | Status: DC
  Administered 2020-11-14 – 2020-11-15 (×2): 100 mg via ORAL
  Filled 2020-11-14 (×2): qty 1

## 2020-11-14 MED ORDER — KETAMINE HCL 10 MG/ML IJ SOLN
INTRAMUSCULAR | Status: DC | PRN
  Administered 2020-11-14: 30 mg via INTRAVENOUS

## 2020-11-14 MED ORDER — VANCOMYCIN HCL 1 G IV SOLR
INTRAVENOUS | Status: DC | PRN
  Administered 2020-11-14: 1000 mg via TOPICAL

## 2020-11-14 MED ORDER — ROCURONIUM BROMIDE 10 MG/ML (PF) SYRINGE
PREFILLED_SYRINGE | INTRAVENOUS | Status: DC | PRN
  Administered 2020-11-14: 50 mg via INTRAVENOUS

## 2020-11-14 MED ORDER — CHLORHEXIDINE GLUCONATE 0.12 % MT SOLN
15.0000 mL | Freq: Once | OROMUCOSAL | Status: AC
  Administered 2020-11-14: 11:00:00 15 mL via OROMUCOSAL

## 2020-11-14 MED ORDER — METOCLOPRAMIDE HCL 5 MG/ML IJ SOLN
5.0000 mg | Freq: Three times a day (TID) | INTRAMUSCULAR | Status: DC | PRN
Start: 2020-11-14 — End: 2020-11-15

## 2020-11-14 MED ORDER — PROPOFOL 10 MG/ML IV BOLUS
INTRAVENOUS | Status: AC
  Filled 2020-11-14: qty 20

## 2020-11-14 MED ORDER — KETAMINE HCL 10 MG/ML IJ SOLN
INTRAMUSCULAR | Status: AC
  Filled 2020-11-14: qty 1

## 2020-11-14 MED ORDER — OXYCODONE HCL ER 10 MG PO T12A
10.0000 mg | EXTENDED_RELEASE_TABLET | Freq: Two times a day (BID) | ORAL | Status: DC
  Administered 2020-11-14 – 2020-11-15 (×2): 10 mg via ORAL
  Filled 2020-11-14 (×2): qty 1

## 2020-11-14 MED ORDER — MIDAZOLAM HCL 2 MG/2ML IJ SOLN
INTRAMUSCULAR | Status: AC
  Filled 2020-11-14: qty 2

## 2020-11-14 MED ORDER — PROPOFOL 500 MG/50ML IV EMUL
INTRAVENOUS | Status: AC
  Filled 2020-11-14: qty 50

## 2020-11-14 MED ORDER — SODIUM CHLORIDE (PF) 0.9 % IJ SOLN
INTRAMUSCULAR | Status: DC | PRN
  Administered 2020-11-14 (×2): 10 mL

## 2020-11-14 MED ORDER — MIDAZOLAM HCL 2 MG/2ML IJ SOLN
INTRAMUSCULAR | Status: DC | PRN
  Administered 2020-11-14: 2 mg via INTRAVENOUS

## 2020-11-14 MED ORDER — FENTANYL CITRATE (PF) 100 MCG/2ML IJ SOLN
25.0000 ug | INTRAMUSCULAR | Status: DC | PRN
Start: 2020-11-14 — End: 2020-11-14
  Administered 2020-11-14: 25 ug via INTRAVENOUS
  Administered 2020-11-14: 50 ug via INTRAVENOUS

## 2020-11-14 MED ORDER — OXYCODONE HCL 5 MG/5ML PO SOLN
5.0000 mg | Freq: Once | ORAL | Status: DC | PRN
Start: 2020-11-14 — End: 2020-11-14

## 2020-11-14 MED ORDER — TRANEXAMIC ACID-NACL 1000-0.7 MG/100ML-% IV SOLN
1000.0000 mg | INTRAVENOUS | Status: DC
  Filled 2020-11-14: qty 100

## 2020-11-14 MED ORDER — ISOPROPYL ALCOHOL 70 % SOLN
Status: AC
  Filled 2020-11-14: qty 480

## 2020-11-14 MED ORDER — KETOROLAC TROMETHAMINE 15 MG/ML IJ SOLN
15.0000 mg | Freq: Four times a day (QID) | INTRAMUSCULAR | Status: DC
  Administered 2020-11-14 – 2020-11-15 (×3): 15 mg via INTRAVENOUS
  Filled 2020-11-14 (×3): qty 1

## 2020-11-14 MED ORDER — LIDOCAINE HCL (PF) 2 % IJ SOLN
INTRAMUSCULAR | Status: AC
  Filled 2020-11-14: qty 5

## 2020-11-14 MED ORDER — MENTHOL 3 MG MT LOZG: 1.0000 | LOZENGE | OROMUCOSAL | Status: DC | PRN

## 2020-11-14 MED ORDER — CEFAZOLIN SODIUM-DEXTROSE 2-4 GM/100ML-% IV SOLN
2.0000 g | Freq: Four times a day (QID) | INTRAVENOUS | Status: AC
  Administered 2020-11-14 – 2020-11-15 (×2): 2 g via INTRAVENOUS
  Filled 2020-11-14 (×2): qty 100

## 2020-11-14 MED ORDER — FENTANYL CITRATE (PF) 100 MCG/2ML IJ SOLN
25.0000 ug | INTRAMUSCULAR | Status: DC | PRN
  Administered 2020-11-14: 25 ug via INTRAVENOUS

## 2020-11-14 MED ORDER — METHOCARBAMOL 500 MG PO TABS: 500.0000 mg | ORAL_TABLET | Freq: Four times a day (QID) | ORAL | Status: DC | PRN

## 2020-11-14 MED ORDER — ONDANSETRON HCL 4 MG PO TABS: 4.0000 mg | ORAL_TABLET | Freq: Four times a day (QID) | ORAL | Status: DC | PRN

## 2020-11-14 SURGICAL SUPPLY — 51 items
BAG SPEC THK2 15X12 ZIP CLS (MISCELLANEOUS) ×1
BAG ZIPLOCK 12X15 (MISCELLANEOUS) ×3 IMPLANT
CELLS DAT CNTRL 66122 CELL SVR (MISCELLANEOUS) ×1 IMPLANT
CLOSURE STERI-STRIP 1/2X4 (GAUZE/BANDAGES/DRESSINGS) ×1
CLOSURE WOUND 1/2 X4 (GAUZE/BANDAGES/DRESSINGS) ×2
CLSR STERI-STRIP ANTIMIC 1/2X4 (GAUZE/BANDAGES/DRESSINGS) ×2 IMPLANT
COVER PERINEAL POST (MISCELLANEOUS) ×3 IMPLANT
COVER SURGICAL LIGHT HANDLE (MISCELLANEOUS) ×3 IMPLANT
COVER WAND RF STERILE (DRAPES) IMPLANT
CUP ACET PNNCL SECTR W/GRIP 56 (Hips) ×1 IMPLANT
DRAPE IMP U-DRAPE 54X76 (DRAPES) ×6 IMPLANT
DRAPE POUCH INSTRU U-SHP 10X18 (DRAPES) ×3 IMPLANT
DRAPE STERI IOBAN 125X83 (DRAPES) ×6 IMPLANT
DRAPE U-SHAPE 47X51 STRL (DRAPES) ×6 IMPLANT
DRSG AQUACEL AG ADV 3.5X10 (GAUZE/BANDAGES/DRESSINGS) ×3 IMPLANT
DURAPREP 26ML APPLICATOR (WOUND CARE) ×6 IMPLANT
ELECT BLADE TIP CTD 4 INCH (ELECTRODE) ×3 IMPLANT
ELECT REM PT RETURN 15FT ADLT (MISCELLANEOUS) ×3 IMPLANT
GLOVE BIOGEL PI IND STRL 7.0 (GLOVE) ×1 IMPLANT
GLOVE BIOGEL PI INDICATOR 7.0 (GLOVE) ×2
GLOVE ECLIPSE 7.0 STRL STRAW (GLOVE) ×6 IMPLANT
GLOVE SURG SYN 7.5  E (GLOVE) ×15
GLOVE SURG SYN 7.5 E (GLOVE) ×5 IMPLANT
GOWN SRG XL LVL 4 BRTHBL STRL (GOWNS) ×2 IMPLANT
GOWN STRL NON-REIN XL LVL4 (GOWNS) ×6
HANDPIECE INTERPULSE COAX TIP (DISPOSABLE) ×3
HEAD CERAMIC DELTA 36 PLUS 1.5 (Hips) ×3 IMPLANT
HOOD PEEL AWAY FLYTE STAYCOOL (MISCELLANEOUS) ×9 IMPLANT
JET LAVAGE IRRISEPT WOUND (IRRIGATION / IRRIGATOR) ×3
KIT BASIN OR (CUSTOM PROCEDURE TRAY) ×3 IMPLANT
KIT TURNOVER KIT A (KITS) IMPLANT
LAVAGE JET IRRISEPT WOUND (IRRIGATION / IRRIGATOR) ×1 IMPLANT
MARKER SKIN DUAL TIP RULER LAB (MISCELLANEOUS) ×6 IMPLANT
NEEDLE SPNL 18GX3.5 QUINCKE PK (NEEDLE) ×3 IMPLANT
PACK ANTERIOR HIP CUSTOM (KITS) ×3 IMPLANT
PENCIL SMOKE EVACUATOR (MISCELLANEOUS) IMPLANT
PINN SECTOR W/GRIP ACE CUP 56 (Hips) ×3 IMPLANT
PINNACLE ALTRX PLUS 4 N 36X56 (Hips) ×3 IMPLANT
RTRCTR WOUND ALEXIS 18CM MED (MISCELLANEOUS) ×3
SAW OSC TIP CART 19.5X105X1.3 (SAW) ×3 IMPLANT
SET HNDPC FAN SPRY TIP SCT (DISPOSABLE) ×1 IMPLANT
STEM FEM SZ3 STD ACTIS (Stem) ×3 IMPLANT
STRIP CLOSURE SKIN 1/2X4 (GAUZE/BANDAGES/DRESSINGS) ×4 IMPLANT
SUT ETHIBOND 2 V 37 (SUTURE) ×3 IMPLANT
SUT MNCRL AB 3-0 PS2 18 (SUTURE) ×3 IMPLANT
SUT VIC AB 0 CT1 36 (SUTURE) ×3 IMPLANT
SUT VIC AB 1 CTX 36 (SUTURE) ×3
SUT VIC AB 1 CTX36XBRD ANBCTR (SUTURE) ×1 IMPLANT
SUT VIC AB 2-0 CT1 27 (SUTURE) ×6
SUT VIC AB 2-0 CT1 TAPERPNT 27 (SUTURE) ×2 IMPLANT
TRAY FOLEY MTR SLVR 16FR STAT (SET/KITS/TRAYS/PACK) IMPLANT

## 2020-11-14 NOTE — Transfer of Care (Signed)
Immediate Anesthesia Transfer of Care Note  Patient: Rodney Dougherty  Procedure(s) Performed: RIGHT TOTAL HIP ARTHROPLASTY ANTERIOR APPROACH (Right Hip)  Patient Location: PACU  Anesthesia Type:General  Level of Consciousness: awake, patient cooperative and responds to stimulation  Airway & Oxygen Therapy: Patient Spontanous Breathing and Patient connected to face mask oxygen  Post-op Assessment: Report given to RN, Post -op Vital signs reviewed and stable and Patient moving all extremities X 4  Post vital signs: Reviewed and stable  Last Vitals:  Vitals Value Taken Time  BP 138/96 11/14/20 1353  Temp    Pulse 69 11/14/20 1355  Resp 16 11/14/20 1355  SpO2 100 % 11/14/20 1355  Vitals shown include unvalidated device data.  Last Pain:  Vitals:   11/14/20 1022  TempSrc: Oral  PainSc: 4       Patients Stated Pain Goal: 4 (77/82/42 [id number])  Complications: No complications documented.

## 2020-11-14 NOTE — Anesthesia Preprocedure Evaluation (Addendum)
Anesthesia Evaluation  Patient identified by MRN, date of birth, ID band Patient awake    Reviewed: Allergy & Precautions, H&P , NPO status , Patient's Chart, lab work & pertinent test results  Airway Mallampati: II   Neck ROM: full    Dental   Pulmonary sleep apnea ,    breath sounds clear to auscultation       Cardiovascular negative cardio ROS   Rhythm:regular Rate:Normal     Neuro/Psych    GI/Hepatic   Endo/Other    Renal/GU      Musculoskeletal  (+) Arthritis ,   Abdominal   Peds  Hematology   Anesthesia Other Findings   Reproductive/Obstetrics                             Anesthesia Physical Anesthesia Plan  ASA: II  Anesthesia Plan: General   Post-op Pain Management:    Induction: Intravenous  PONV Risk Score and Plan: 2 and Propofol infusion, Treatment may vary due to age or medical condition, Ondansetron and Midazolam  Airway Management Planned: Oral ETT  Additional Equipment:   Intra-op Plan:   Post-operative Plan: Extubation in OR  Informed Consent: I have reviewed the patients History and Physical, chart, labs and discussed the procedure including the risks, benefits and alternatives for the proposed anesthesia with the patient or authorized representative who has indicated his/her understanding and acceptance.       Plan Discussed with: CRNA, Anesthesiologist and Surgeon  Anesthesia Plan Comments:        Anesthesia Quick Evaluation

## 2020-11-14 NOTE — H&P (Signed)
PREOPERATIVE H&P  Chief Complaint: right hip degenerative joint disease  HPI: Rodney Dougherty is a 64 y.o. male who presents for surgical treatment of right hip degenerative joint disease.  He denies any changes in medical history.  Past Medical History:  Diagnosis Date  . Arthritis   . Hyperlipidemia   . Pre-diabetes   . Skin cancer    Face  . Sleep apnea    Does not wear CPAP   Past Surgical History:  Procedure Laterality Date  . BACK SURGERY     pt. reports it was about 20 yrs. ago, no problems since then    . HAND TENDON SURGERY Left 1999   occupational injury involving artery as well  . HERNIA REPAIR Left 1975   inguinal   . INGUINAL HERNIA REPAIR Left 04/16/2017   Procedure: LAPAROSCOPIC LEFT INGUINAL HERNIA REPAIR;  Surgeon: Clovis Riley, MD;  Location: [place];  Service: General;  Laterality: Left;  . INSERTION OF MESH Left 04/16/2017   Procedure: INSERTION OF MESH;  Surgeon: Clovis Riley, MD;  Location: [facility] OR;  Service: General;  Laterality: Left;   Social History   Socioeconomic History  . Marital status: Single    Spouse name: Not on file  . Number of children: Not on file  . Years of education: Not on file  . Highest education level: Not on file  Occupational History  . Occupation: Full time  Tobacco Use  . Smoking status: Never Smoker  . Smokeless tobacco: Never Used  Vaping Use  . Vaping Use: Never used  Substance and Sexual Activity  . Alcohol use: Yes    Comment: rarely  . Drug use: No  . Sexual activity: Not on file  Other Topics Concern  . Not on file  Social History Narrative  . Not on file   Social Determinants of Health   Financial Resource Strain: Not on file  Food Insecurity: Not on file  Transportation Needs: Not on file  Physical Activity: Not on file  Stress: Not on file  Social Connections: Not on file   Family History  Problem Relation Age of Onset  . Stroke Mother   . Colon cancer Neg Hx   . Colon polyps Neg  Hx   . Esophageal cancer Neg Hx   . Stomach cancer Neg Hx   . Rectal cancer Neg Hx    No Known Allergies Prior to Admission medications   Medication Sig Start Date End Date Taking? Authorizing Provider  rosuvastatin (CRESTOR) 10 MG tablet Take 1 tablet (10 mg total) by mouth daily. 08/19/20  Yes Elby Beck, FNP  aspirin EC 81 MG tablet Take 1 tablet (81 mg total) by mouth in the morning and at bedtime. To be taken post-op Patient not taking: No sig reported 11/07/20   Aundra Dubin, PA-C  docusate sodium (COLACE) 100 MG capsule Take 1 capsule (100 mg total) by mouth daily as needed. Patient not taking: Reported on 11/09/2020 11/07/20 11/07/21  Aundra Dubin, PA-C  ibuprofen (ADVIL) 200 MG tablet Take 400 mg by mouth every 6 (six) hours as needed for moderate pain.    [provider]  methocarbamol (ROBAXIN) 500 MG tablet Take 1 tablet (500 mg total) by mouth 2 (two) times daily as needed. To be taken post-op Patient not taking: Reported on 11/09/2020 11/07/20   Aundra Dubin, PA-C  ondansetron (ZOFRAN) 4 MG tablet Take 1 tablet (4 mg total) by mouth every 8 (  eight) hours as needed for nausea or vomiting. Patient not taking: Reported on 11/09/2020 11/07/20   Aundra Dubin, PA-C  oxyCODONE-acetaminophen (PERCOCET) 5-325 MG tablet Take 1-2 tablets by mouth every 6 (six) hours as needed. To be taken post-op Patient not taking: Reported on 11/09/2020 11/07/20   Aundra Dubin, PA-C     Positive ROS: All other systems have been reviewed and were otherwise negative with the exception of those mentioned in the HPI and as above.  Physical Exam: General: Alert, no acute distress Cardiovascular: No pedal edema Respiratory: No cyanosis, no use of accessory musculature GI: abdomen soft Skin: No lesions in the area of chief complaint Neurologic: Sensation intact distally Psychiatric: Patient is competent for consent with normal mood and affect Lymphatic: no  lymphedema  MUSCULOSKELETAL: exam stable  Assessment: right hip degenerative joint disease  Plan: Plan for Procedure(s): RIGHT TOTAL HIP ARTHROPLASTY ANTERIOR APPROACH  The risks benefits and alternatives were discussed with the patient including but not limited to the risks of nonoperative treatment, versus surgical intervention including infection, bleeding, nerve injury,  blood clots, cardiopulmonary complications, morbidity, mortality, among others, and they were willing to proceed.   Preoperative templating of the joint replacement has been completed, documented, and submitted to the Operating Room personnel in order to optimize intra-operative equipment management.   Eduard Roux, MD 11/14/2020 11:11 AM

## 2020-11-14 NOTE — Anesthesia Procedure Notes (Signed)
Procedure Name: Intubation Date/Time: 11/14/2020 1:08 PM Performed by: Michele Rockers, CRNA Pre-anesthesia Checklist: Patient identified, Patient being monitored, Timeout performed, Emergency Drugs available and Suction available Patient Re-evaluated:Patient Re-evaluated prior to induction Oxygen Delivery Method: Circle System Utilized Preoxygenation: Pre-oxygenation with 100% oxygen Induction Type: IV induction Ventilation: Mask ventilation without difficulty Laryngoscope Size: Miller and 2 Grade View: Grade I Tube type: Oral Tube size: 7.5 mm Number of attempts: 1 Airway Equipment and Method: Stylet Placement Confirmation: ETT inserted through vocal cords under direct vision,  positive ETCO2 and breath sounds checked- equal and bilateral Secured at: 21 cm Tube secured with: Tape Dental Injury: Teeth and Oropharynx as per pre-operative assessment

## 2020-11-14 NOTE — Discharge Instructions (Signed)

## 2020-11-14 NOTE — Op Note (Signed)
RIGHT TOTAL HIP ARTHROPLASTY ANTERIOR APPROACH  Procedure Note JANOAH MENNA   [id number]  Pre-op Diagnosis: right hip degenerative joint disease     Post-op Diagnosis: same   Operative Procedures  1. Total hip replacement; Right hip; uncemented [id number]   Personnel  Surgeon(s): Leandrew Koyanagi, MD  Assist: Madalyn Rob, PA-C; necessary for the timely completion of procedure and due to complexity of procedure.   Anesthesia: general, exparel local  Prosthesis: Depuy Acetabulum: Pinnacle 56 mm Femur: Actis 3 STD Head: 36 mm size: +1.5 Liner: +4 Bearing Type: ceramic on poly  Total Hip Arthroplasty (Anterior Approach) Op Note:  After informed consent was obtained and the operative extremity marked in the holding area, the patient was brought back to the operating room and placed supine on the HANA table. Next, the operative extremity was prepped and draped in normal sterile fashion. Surgical timeout occurred verifying patient identification, surgical site, surgical procedure and administration of antibiotics.  A modified anterior Smith-Peterson approach to the hip was performed, using the interval between tensor fascia lata and sartorius.  Dissection was carried bluntly down onto the anterior hip capsule. The lateral femoral circumflex vessels were identified and coagulated. A capsulotomy was performed and the capsular flaps tagged for later repair.  The neck osteotomy was performed. The femoral head was removed which was severely degenerative, the acetabular rim was cleared of soft tissue and attention was turned to reaming the acetabulum.  Sequential reaming was performed under fluoroscopic guidance. We reamed to a size 55 mm, and then impacted the acetabular shell.  The liner was then placed after irrigation and attention turned to the femur.  After placing the femoral hook, the leg was taken to externally rotated, extended and adducted position taking care to perform soft  tissue releases to allow for adequate mobilization of the femur. Soft tissue was cleared from the shoulder of the greater trochanter and the hook elevator used to improve exposure of the proximal femur. Sequential broaching performed up to a size 3. Trial neck and head were placed. The leg was brought back up to neutral and the construct reduced.  Antibiotic irrigation was placed in the surgical wound and kept for at least 1 minute.  The position and sizing of components, offset and leg lengths were checked using fluoroscopy. Stability of the construct was checked in extension and external rotation without any subluxation or impingement of prosthesis. We dislocated the prosthesis, dropped the leg back into position, removed trial components, and irrigated copiously. The final stem and head was then placed, the leg brought back up, the system reduced and fluoroscopy used to verify positioning.  We irrigated, obtained hemostasis and closed the capsule using #2 ethibond suture.  One gram of vancomycin powder was placed in the surgical bed. A dilute solution of 20 cc of normal saline, 1.3% exparel, 0.25% bupivacaine was injected in the soft tissues.  One gram of topical tranexamic acid was injected into the joint.  The fascia was closed with #1 vicryl plus, the deep fat layer was closed with 0 vicryl, the subcutaneous layers closed with 2.0 Vicryl Plus and the skin closed with 3.0 monocryl and steri strips. A sterile dressing was applied. The patient was awakened in the operating room and taken to recovery in stable condition.  All sponge, needle, and instrument counts were correct at the end of the case.   Position: supine  Complications: see description of procedure.  Time Out: performed   Drains/Packing: none  Estimated  blood loss: see anesthesia record  Returned to Recovery Room: in good condition.   Antibiotics: yes   Mechanical VTE (DVT) Prophylaxis: sequential compression devices, TED thigh-high   Chemical VTE (DVT) Prophylaxis: aspirin   Fluid Replacement: see anesthesia record  Specimens Removed: 1 to pathology   Sponge and Instrument Count Correct? yes   PACU: portable radiograph - low AP   Plan/RTC: Return in 2 weeks for staple removal. Weight Bearing/Load Lower Extremity: full  Hip precautions: none Suture Removal: 2 weeks   N. Eduard Roux, MD Marga Hoots 1:21 PM   Implant Name Type Inv. Item Serial No. Manufacturer Lot No. LRB No. Used Action  PINN SECTOR W/GRIP ACE CUP 56 - [id number] Hips PINN SECTOR W/GRIP ACE CUP 56  [employer] [id number] Right 1 Implanted  PINNACLE ALTRX PLUS 4 N 36X56 - [id number] Hips PINNACLE ALTRX PLUS 4 N 36X56  [employer] [id number] Right 1 Implanted  STEM FEM SZ3 STD ACTIS - [id number] Stem STEM FEM SZ3 STD ACTIS  [employer] [id number] Right 1 Implanted  HEAD CERAMIC DELTA 36 PLUS 1.5 - [id number] Hips HEAD CERAMIC DELTA 36 PLUS 1.5  [employer] [id number] Right 1 Implanted

## 2020-11-14 NOTE — Plan of Care (Signed)
  Problem: Education: Goal: Knowledge of General Education information will improve Description: Including pain rating scale, medication(s)/side effects and non-pharmacologic comfort measures Outcome: Progressing   Problem: Health Behavior/Discharge Planning: Goal: Ability to manage health-related needs will improve Outcome: Progressing   Problem: Clinical Measurements: Goal: Ability to maintain clinical measurements within normal limits will improve Outcome: Progressing   Problem: Activity: Goal: Risk for activity intolerance will decrease Outcome: Progressing   Problem: Nutrition: Goal: Adequate nutrition will be maintained Outcome: Progressing   Problem: Elimination: Goal: Will not experience complications related to urinary retention Outcome: Progressing   Problem: Pain Managment: Goal: General experience of comfort will improve Outcome: Progressing

## 2020-11-14 NOTE — Evaluation (Addendum)
Physical Therapy Evaluation Patient Details Name: Rodney Dougherty MRN: [id number] DOB: 1956/02/01 Today's Date: 11/14/2020   History of Present Illness  Patient is 64 y.o. male s/p Rt THA anterior approach on 11/14/20 with PMH significant for OA, HLD.  Clinical Impression  Rodney Dougherty is a 64 y.o. male POD 0 s/p Rt THA. Patient reports independence with mobility at baseline. Patient is now limited by functional impairments (see PT problem list below) and requires min assist for transfers and gait with RW. Patient was able to ambulate ~140 feet with RW and min assist. Patient instructed in exercise to facilitate strengthening and circulation. Patient will benefit from continued skilled PT interventions to address impairments and progress towards PLOF. Acute PT will follow to progress mobility and stair training in preparation for safe discharge home.     Follow Up Recommendations Follow surgeons recommendation for DC plan and follow-up therapies    Equipment Recommendations  Rolling walker with 5" wheels;3in1 (PT)    Recommendations for Other Services       Precautions / Restrictions Precautions Precautions: Fall Restrictions Weight Bearing Restrictions: No Other Position/Activity Restrictions: WBAT      Mobility  Bed Mobility Overal bed mobility: Needs Assistance Bed Mobility: Supine to Sit     Supine to sit: Min assist     General bed mobility comments: cues for use of bed rail and light assist Rt LE to move to EOB.    Transfers Overall transfer level: Needs assistance Equipment used: Rolling walker (2 wheeled) Transfers: Sit to/from Stand Sit to Stand: Min assist         General transfer comment: cue for technique with RW, assist for power up and to steady with rising.  Ambulation/Gait Ambulation/Gait assistance: Min assist Gait Distance (Feet): 140 Feet Assistive device: Rolling walker (2 wheeled) Gait Pattern/deviations: Step-to pattern;Decreased stride  length;Step-through pattern Gait velocity: decr   General Gait Details: cues for step pattern and proximiy to RW, no overt LOB noted. pt preferring step through pattern.  Stairs            Wheelchair Mobility    Modified Rankin (Stroke Patients Only)       Balance Overall balance assessment: Needs assistance Sitting-balance support: Feet supported Sitting balance-Leahy Scale: Good     Standing balance support: During functional activity;Bilateral upper extremity supported Standing balance-Leahy Scale: Fair                               Pertinent Vitals/Pain Pain Assessment: No/denies pain    Home Living Family/patient expects to be discharged to:: Private residence (Simultaneous filing. User may not have seen previous data.) Living Arrangements: Children (Simultaneous filing. User may not have seen previous data.) Available Help at Discharge: Family Type of Home: House Home Access: Stairs to enter Entrance Stairs-Rails: None Entrance Stairs-Number of Steps: 1 Home Layout: One level Home Equipment: Crutches      Prior Function Level of Independence: Independent               Hand Dominance   Dominant Hand: Right    Extremity/Trunk Assessment   Upper Extremity Assessment Upper Extremity Assessment: Overall WFL for tasks assessed    Lower Extremity Assessment Lower Extremity Assessment: Overall WFL for tasks assessed    Cervical / Trunk Assessment Cervical / Trunk Assessment: Normal  Communication   Communication: No difficulties  Cognition Arousal/Alertness: Awake/alert Behavior During Therapy: WFL for tasks assessed/performed Overall Cognitive  Status: Within Functional Limits for tasks assessed                                        General Comments      Exercises Total Joint Exercises Ankle Circles/Pumps: AROM;Both;20 reps;Seated Long Arc Quad: AROM;Right;10 reps;Seated   Assessment/Plan    PT  Assessment Patient needs continued PT services  PT Problem List Decreased strength;Decreased range of motion;Decreased mobility;Decreased activity tolerance;Decreased balance;Decreased knowledge of use of DME       PT Treatment Interventions DME instruction;Gait training;Stair training;Functional mobility training;Therapeutic activities;Therapeutic exercise;Balance training;Patient/family education    PT Goals (Current goals can be found in the Care Plan section)  Acute Rehab PT Goals Patient Stated Goal: to get back to independence and wood working PT Goal Formulation: With patient Time For Goal Achievement: 11/21/20 Potential to Achieve Goals: Good    Frequency 7X/week   Barriers to discharge        Co-evaluation               AM-PAC PT "6 Clicks" Mobility  Outcome Measure Help needed turning from your back to your side while in a flat bed without using bedrails?: None Help needed moving from lying on your back to sitting on the side of a flat bed without using bedrails?: A Little Help needed moving to and from a bed to a chair (including a wheelchair)?: A Little Help needed standing up from a chair using your arms (e.g., wheelchair or bedside chair)?: A Little Help needed to walk in hospital room?: A Little Help needed climbing 3-5 steps with a railing? : A Little 6 Click Score: 19    End of Session Equipment Utilized During Treatment: Gait belt Activity Tolerance: Patient tolerated treatment well Patient left: in chair;with call bell/phone within reach;with chair alarm set;with family/visitor present Nurse Communication: Mobility status PT Visit Diagnosis: Muscle weakness (generalized) (M62.81);Difficulty in walking, not elsewhere classified (R26.2)    Time: 7673-4193 PT Time Calculation (min) (ACUTE ONLY): 25 min   Charges:   PT Evaluation $PT Eval Low Complexity: 1 Low PT Treatments $Gait Training: 8-22 mins        Verner Mould, DPT Acute  Rehabilitation Services  Office [phone] Pager [phone]  11/14/2020 5:35 PM

## 2020-11-14 NOTE — Plan of Care (Signed)
  Problem: Education: Goal: Knowledge of General Education information will improve Description: Including pain rating scale, medication(s)/side effects and non-pharmacologic comfort measures Outcome: Progressing   Problem: Health Behavior/Discharge Planning: Goal: Ability to manage health-related needs will improve Outcome: Progressing   Problem: Clinical Measurements: Goal: Ability to maintain clinical measurements within normal limits will improve Outcome: Progressing Goal: Will remain free from infection Outcome: Progressing Goal: Diagnostic test results will improve Outcome: Progressing Goal: Respiratory complications will improve Outcome: Progressing Goal: Cardiovascular complication will be avoided Outcome: Progressing   Problem: Activity: Goal: Risk for activity intolerance will decrease Outcome: Progressing   Problem: Nutrition: Goal: Adequate nutrition will be maintained Outcome: Progressing   Problem: Coping: Goal: Level of anxiety will decrease Outcome: Progressing   Problem: Pain Managment: Goal: General experience of comfort will improve Outcome: Progressing   Problem: Elimination: Goal: Will not experience complications related to bowel motility Outcome: Progressing Goal: Will not experience complications related to urinary retention Outcome: Progressing   Problem: Safety: Goal: Ability to remain free from injury will improve Outcome: Progressing   Problem: Skin Integrity: Goal: Risk for impaired skin integrity will decrease Outcome: Progressing   Problem: Education: Goal: Knowledge of the prescribed therapeutic regimen will improve Outcome: Progressing Goal: Understanding of discharge needs will improve Outcome: Progressing Goal: Individualized Educational Video(s) Outcome: Progressing   Problem: Activity: Goal: Ability to avoid complications of mobility impairment will improve Outcome: Progressing Goal: Ability to tolerate increased  activity will improve Outcome: Progressing   Problem: Clinical Measurements: Goal: Postoperative complications will be avoided or minimized Outcome: Progressing   Problem: Pain Management: Goal: Pain level will decrease with appropriate interventions Outcome: Progressing   Problem: Skin Integrity: Goal: Will show signs of wound healing Outcome: Progressing

## 2020-11-15 ENCOUNTER — Encounter (HOSPITAL_COMMUNITY): Payer: Self-pay | Admitting: Orthopaedic Surgery

## 2020-11-15 DIAGNOSIS — M1611 Unilateral primary osteoarthritis, right hip: Secondary | ICD-10-CM | POA: Diagnosis not present

## 2020-11-15 LAB — BASIC METABOLIC PANEL
Anion gap: 10 (ref 5–15)
BUN: 19 mg/dL (ref 8–23)
CO2: 23 mmol/L (ref 22–32)
Calcium: 8 mg/dL — ABNORMAL LOW (ref 8.9–10.3)
Chloride: 104 mmol/L (ref 98–111)
Creatinine, Ser: 1.01 mg/dL (ref 0.61–1.24)
GFR, Estimated: >60 mL/min (ref >60)
Glucose, Bld: 165 mg/dL — ABNORMAL HIGH (ref 70–99)
Potassium: 4.1 mmol/L (ref 3.5–5.1)
Sodium: 137 mmol/L (ref 135–145)

## 2020-11-15 LAB — CBC
HCT: 36.3 % — ABNORMAL LOW (ref 39.0–52.0)
Hemoglobin: 12 g/dL — ABNORMAL LOW (ref 13.0–17.0)
MCH: 28.6 pg (ref 26.0–34.0)
MCHC: 33.1 g/dL (ref 30.0–36.0)
MCV: 86.6 fL (ref 80.0–100.0)
Platelets: 240 10*3/uL (ref 150–400)
RBC: 4.19 MIL/uL — ABNORMAL LOW (ref 4.22–5.81)
RDW: 13.4 % (ref 11.5–15.5)
WBC: 12 10*3/uL — ABNORMAL HIGH (ref 4.0–10.5)
nRBC: 0 % (ref 0.0–0.2)

## 2020-11-15 NOTE — Plan of Care (Signed)
  Problem: Education: Goal: Knowledge of General Education information will improve Description: Including pain rating scale, medication(s)/side effects and non-pharmacologic comfort measures Outcome: Progressing   Problem: Health Behavior/Discharge Planning: Goal: Ability to manage health-related needs will improve Outcome: Progressing   Problem: Clinical Measurements: Goal: Ability to maintain clinical measurements within normal limits will improve Outcome: Progressing Goal: Will remain free from infection Outcome: Progressing Goal: Diagnostic test results will improve Outcome: Progressing   Problem: Activity: Goal: Risk for activity intolerance will decrease Outcome: Progressing   Problem: Nutrition: Goal: Adequate nutrition will be maintained Outcome: Progressing   Problem: Elimination: Goal: Will not experience complications related to bowel motility Outcome: Progressing Goal: Will not experience complications related to urinary retention Outcome: Progressing   Problem: Pain Managment: Goal: General experience of comfort will improve Outcome: Progressing   Problem: Safety: Goal: Ability to remain free from injury will improve Outcome: Progressing   Problem: Skin Integrity: Goal: Risk for impaired skin integrity will decrease Outcome: Progressing   Problem: Education: Goal: Knowledge of the prescribed therapeutic regimen will improve Outcome: Progressing Goal: Understanding of discharge needs will improve Outcome: Progressing Goal: Individualized Educational Video(s) Outcome: Progressing   Problem: Activity: Goal: Ability to avoid complications of mobility impairment will improve Outcome: Progressing Goal: Ability to tolerate increased activity will improve Outcome: Progressing   Problem: Clinical Measurements: Goal: Postoperative complications will be avoided or minimized Outcome: Progressing   Problem: Pain Management: Goal: Pain level will decrease  with appropriate interventions Outcome: Progressing   Problem: Skin Integrity: Goal: Will show signs of wound healing Outcome: Progressing

## 2020-11-15 NOTE — Progress Notes (Addendum)
Subjective: 1 Day Post-Op Procedure(s) (LRB): RIGHT TOTAL HIP ARTHROPLASTY ANTERIOR APPROACH (Right) Patient reports pain as mild.    Objective: Vital signs in last 24 hours: Temp:  [97.4 F (36.3 C)-98.6 F (37 C)] 97.9 F (36.6 C) (12/14 0548) Pulse Rate:  [54-78] 58 (12/14 0548) Resp:  [10-21] 16 (12/14 0548) BP: (115-166)/(68-96) 115/68 (12/14 0548) SpO2:  [93 %-100 %] 96 % (12/14 0548) Weight:  [87.6 kg] 87.6 kg (12/13 1022)  Intake/Output from previous day: 12/13 0701 - 12/14 0700 In: 3860.8 [P.O.:600; I.V.:2840.8; IV Piggyback:420] Out: 3428 [Urine:1125; Blood:150] Intake/Output this shift: No intake/output data recorded.  Recent Labs    11/15/20 0316  HGB 12.0*   Recent Labs    11/15/20 0316  WBC 12.0*  RBC 4.19*  HCT 36.3*  PLT 240   Recent Labs    11/15/20 0316  NA 137  K 4.1  CL 104  CO2 23  BUN 19  CREATININE 1.01  GLUCOSE 165*  CALCIUM 8.0*   No results for input(s): LABPT, INR in the last 72 hours.  Neurologically intact Neurovascular intact Sensation intact distally Intact pulses distally Dorsiflexion/Plantar flexion intact Incision: scant drainage No cellulitis present Compartment soft   Assessment/Plan: 1 Day Post-Op Procedure(s) (LRB): RIGHT TOTAL HIP ARTHROPLASTY ANTERIOR APPROACH (Right) Advance diet Up with therapy D/C IV fluids Discharge home with home health after first or second PT session (depending on progression and patient comfort) WBAT RLE ABLA- mild and stable Post-op meds sent in last week      Aundra Dubin 11/15/2020, 7:45 AM

## 2020-11-15 NOTE — Progress Notes (Signed)
Physical Therapy Treatment Patient Details Name: Rodney Dougherty MRN: [id number] DOB: Mar 05, 1956 Today's Date: 11/15/2020    History of Present Illness Patient is 64 y.o. male s/p Rt THA anterior approach on 11/14/20 with PMH significant for OA, HLD.    PT Comments    POD # 1 am session Pt performing VERY well.  Tolerated a functional distance amb and practiced one step he has to enter home.  Then returned to room to perform some TE's following HEP handout.  Instructed on proper tech, freq as well as use of ICE.   Addressed all mobility questions, discussed appropriate activity, educated on use of ICE.  Pt ready for D/C to home.   Follow Up Recommendations  Follow surgeon's recommendation for DC plan and follow-up therapies     Equipment Recommendations  Rolling walker with 5" wheels;3in1 (PT)    Recommendations for Other Services       Precautions / Restrictions Precautions Precautions: Fall Restrictions Weight Bearing Restrictions: No Other Position/Activity Restrictions: WBAT    Mobility  Bed Mobility Overal bed mobility: Modified Independent             General bed mobility comments: increased time  Transfers Overall transfer level: Modified independent Equipment used: Rolling walker (2 wheeled) Transfers: Sit to/from [facility] Sit to Stand: Modified independent (Device/Increase time) Stand pivot transfers: Modified independent (Device/Increase time)       General transfer comment: good safety cognition and use of hands to steady self  Ambulation/Gait Ambulation/Gait assistance: Supervision Gait Distance (Feet): 300 Feet Assistive device: Rolling walker (2 wheeled) Gait Pattern/deviations: Step-to pattern;Decreased stride length;Step-through pattern Gait velocity: decr   General Gait Details: cues for step pattern and proximiy to RW, no overt LOB noted. pt preferring step through pattern.   Stairs Stairs: Yes Stairs assistance:  Supervision Stair Management: No rails;Step to pattern;Forwards;With walker Number of Stairs: 1 General stair comments: 25% VC's on proper walker placement and proper sequencing up/down one step/curb performed twice   Wheelchair Mobility    Modified Rankin (Stroke Patients Only)       Balance                                            Cognition Arousal/Alertness: Awake/alert Behavior During Therapy: WFL for tasks assessed/performed Overall Cognitive Status: Within Functional Limits for tasks assessed                                 General Comments: AxO x 3 very motivated      Exercises   Total Hip Replacement TE's following HEP Handout 10 reps ankle pumps 05 reps knee presses 05 reps heel slides 05 reps SAQ's 05 reps ABD 05 reps LAQ's 05 reps all standing TE's  Instructed how to use a belt loop to assist  Followed by ICE     General Comments        Pertinent Vitals/Pain Pain Assessment: 0-10 Pain Score: 2  Pain Location: R hip Pain Descriptors / Indicators: Aching;Tightness Pain Intervention(s): Monitored during session;Premedicated before session;Repositioned;Ice applied    Home Living                      Prior Function            PT Goals (current goals can now  be found in the care plan section) Progress towards PT goals: Progressing toward goals    Frequency    7X/week      PT Plan Current plan remains appropriate    Co-evaluation              AM-PAC PT "6 Clicks" Mobility   Outcome Measure  Help needed turning from your back to your side while in a flat bed without using bedrails?: None Help needed moving from lying on your back to sitting on the side of a flat bed without using bedrails?: None Help needed moving to and from a bed to a chair (including a wheelchair)?: None Help needed standing up from a chair using your arms (e.g., wheelchair or bedside chair)?: None Help needed to walk  in hospital room?: None Help needed climbing 3-5 steps with a railing? : A Little 6 Click Score: 23    End of Session Equipment Utilized During Treatment: Gait belt Activity Tolerance: Patient tolerated treatment well Patient left: in chair;with call bell/phone within reach;with chair alarm set Nurse Communication: Mobility status (pt ready for D/C to home one session) PT Visit Diagnosis: Muscle weakness (generalized) (M62.81);Difficulty in walking, not elsewhere classified (R26.2)     Time: 6270-3500 PT Time Calculation (min) (ACUTE ONLY): 28 min  Charges:  $Gait Training: 8-22 mins $Therapeutic Exercise: 8-22 mins                     Rica Koyanagi  PTA Acute  Rehabilitation Services Pager      [phone] Office      [phone]

## 2020-11-15 NOTE — Anesthesia Postprocedure Evaluation (Addendum)
Anesthesia Post Note  Patient: Rodney Dougherty  Procedure(s) Performed: RIGHT TOTAL HIP ARTHROPLASTY ANTERIOR APPROACH (Right Hip)     Patient location during evaluation: PACU Anesthesia Type: General Level of consciousness: awake and alert Pain management: pain level controlled Vital Signs Assessment: post-procedure vital signs reviewed and stable Respiratory status: spontaneous breathing, nonlabored ventilation, respiratory function stable and patient connected to nasal cannula oxygen Cardiovascular status: blood pressure returned to baseline and stable Postop Assessment: no apparent nausea or vomiting Anesthetic complications: no   No complications documented.  Last Vitals:  Vitals:   11/15/20 0211 11/15/20 0548  BP: 116/69 115/68  Pulse: 63 (!) 58  Resp: 14 16  Temp: 36.8 C 36.6 C  SpO2: 93% 96%    Last Pain:  Vitals:   11/15/20 0804  TempSrc:   PainSc: 0-No pain                 Oviya Ammar S

## 2020-11-15 NOTE — TOC Transition Note (Addendum)
Transition of Care [facility]) - CM/SW Discharge Note   Patient Details  Name: DAVIDE RISDON MRN: [id number] Date of Birth: 1956-04-03  Transition of Care [facility]) CM/SW Contact:  Lia Hopping, [place] Phone Number: 11/15/2020, 10:28 AM   Clinical Narrative:    Prearranged Therapy Plan: HHPT [facility] RW and 3 in1 ordered through [employer] and will be delivered to the patient bedside.    Final next level of care: [place] Barriers to Discharge: Barriers Resolved   Patient Goals and CMS Choice        Discharge Placement                       Discharge Plan and Services                DME Arranged: 3-N-1,Walker rolling DME Agency: [employer] Date DME Agency Contacted: 11/15/20 Time DME Agency Contacted: [phone] Representative spoke with at DME Agency: Freda Munro HH Arranged: PT [place]: [facility] (formerly [employer]) Date [place]: 11/15/20 Time [place]: [phone] Representative spoke with at [place]: [facility] Interventions     Readmission Risk Interventions No flowsheet data found.

## 2020-11-15 NOTE — Discharge Summary (Signed)
Patient ID: Rodney Dougherty MRN: [id number] DOB/AGE: [birth year] 64 y.o.  Admit date: 11/14/2020 Discharge date: 11/15/2020  Admission Diagnoses:  Principal Problem:   Primary osteoarthritis of right hip Active Problems:   Status post total replacement of right hip   Discharge Diagnoses:  Same  Past Medical History:  Diagnosis Date  . Arthritis   . Hyperlipidemia   . Pre-diabetes   . Skin cancer    Face  . Sleep apnea    Does not wear CPAP    Surgeries: Procedure(s): RIGHT TOTAL HIP ARTHROPLASTY ANTERIOR APPROACH on 11/14/2020   Consultants:   Discharged Condition: Improved  Hospital Course: Rodney Dougherty is an 64 y.o. male who was admitted 11/14/2020 for operative treatment ofPrimary osteoarthritis of right hip. Patient has severe unremitting pain that affects sleep, daily activities, and work/hobbies. After pre-op clearance the patient was taken to the operating room on 11/14/2020 and underwent  Procedure(s): RIGHT TOTAL HIP ARTHROPLASTY ANTERIOR APPROACH.    Patient was given perioperative antibiotics:  Anti-infectives (From admission, onward)   Start     Dose/Rate Route Frequency Ordered Stop   11/14/20 1800  ceFAZolin (ANCEF) IVPB 2g/100 mL premix        2 g 200 mL/hr over 30 Minutes Intravenous Every 6 hours 11/14/20 1532 11/15/20 0113   11/14/20 1202  vancomycin (VANCOCIN) powder  Status:  Discontinued          As needed 11/14/20 1202 11/14/20 1523   11/14/20 1015  ceFAZolin (ANCEF) IVPB 2g/100 mL premix        2 g 200 mL/hr over 30 Minutes Intravenous On call to O.R. 11/14/20 1011 11/14/20 1200       Patient was given sequential compression devices, early ambulation, and chemoprophylaxis to prevent DVT.  Patient benefited maximally from hospital stay and there were no complications.    Recent vital signs:  Patient Vitals for the past 24 hrs:  BP Temp Temp src Pulse Resp SpO2 Height Weight  11/15/20 0548 115/68 97.9 F (36.6 C) Oral (!) 58 16 96 % --  --  11/15/20 0211 116/69 98.3 F (36.8 C) Oral 63 14 93 % -- --  11/14/20 1928 115/68 98.6 F (37 C) Oral 68 14 97 % -- --  11/14/20 1844 120/69 97.8 F (36.6 C) Oral 75 20 97 % -- --  11/14/20 1732 125/74 98 F (36.7 C) Oral 78 18 97 % -- --  11/14/20 1544 (!) 153/74 98.5 F (36.9 C) Oral 61 -- 100 % -- --  11/14/20 1515 (!) 157/77 98 F (36.7 C) -- (!) 54 13 100 % -- --  11/14/20 1500 (!) 166/83 -- -- (!) 55 13 100 % -- --  11/14/20 1445 (!) 154/80 -- -- (!) 58 13 100 % -- --  11/14/20 1430 (!) 159/85 -- -- 60 16 100 % -- --  11/14/20 1415 (!) 162/79 -- -- 69 19 100 % -- --  11/14/20 1405 -- -- -- 78 (!) 21 100 % -- --  11/14/20 1400 (!) 149/88 -- -- 72 13 100 % -- --  11/14/20 1353 (!) 138/96 (!) 97.4 F (36.3 C) -- 69 10 100 % -- --  11/14/20 1022 (!) 147/81 97.6 F (36.4 C) Oral 61 18 99 % 5\' 9"  (1.753 m) 87.6 kg     Recent laboratory studies:  Recent Labs    11/15/20 0316  WBC 12.0*  HGB 12.0*  HCT 36.3*  PLT 240  NA 137  K 4.1  CL 104  CO2 23  BUN 19  CREATININE 1.01  GLUCOSE 165*  CALCIUM 8.0*     Discharge Medications:   Allergies as of 11/15/2020   No Known Allergies     Medication List    STOP taking these medications   ibuprofen 200 MG tablet Commonly known as: ADVIL     TAKE these medications   aspirin EC 81 MG tablet Take 1 tablet (81 mg total) by mouth in the morning and at bedtime. To be taken post-op   docusate sodium 100 MG capsule Commonly known as: Colace Take 1 capsule (100 mg total) by mouth daily as needed.   methocarbamol 500 MG tablet Commonly known as: Robaxin Take 1 tablet (500 mg total) by mouth 2 (two) times daily as needed. To be taken post-op   ondansetron 4 MG tablet Commonly known as: Zofran Take 1 tablet (4 mg total) by mouth every 8 (eight) hours as needed for nausea or vomiting.   oxyCODONE-acetaminophen 5-325 MG tablet Commonly known as: Percocet Take 1-2 tablets by mouth every 6 (six) hours as needed. To  be taken post-op   rosuvastatin 10 MG tablet Commonly known as: CRESTOR Take 1 tablet (10 mg total) by mouth daily.            Durable Medical Equipment  (From admission, onward)         Start     Ordered   11/14/20 1533  DME Walker rolling  Once       Question:  Patient needs a walker to treat with the following condition  Answer:  History of hip replacement   11/14/20 1532   11/14/20 1533  DME 3 n 1  Once        11/14/20 1532   11/14/20 1533  DME Bedside commode  Once       Question:  Patient needs a bedside commode to treat with the following condition  Answer:  History of hip replacement   11/14/20 1532          Diagnostic Studies: DG Chest 2 View  Result Date: 11/11/2020 CLINICAL DATA:  Pre-surgery testing. EXAM: CHEST - 2 VIEW COMPARISON:  Chest x-ray report 06/26/1999. FINDINGS: Mediastinum and hilar structures normal. Heart size normal. No focal infiltrate. No pleural effusion or pneumothorax. Degenerative change thoracic spine. Old right posterior seventh rib fracture. IMPRESSION: No acute cardiopulmonary disease. Electronically Signed   By: Marcello Moores  Register   On: 11/11/2020 15:00   DG Pelvis Portable  Result Date: 11/14/2020 CLINICAL DATA:  Status post right total hip arthroplasty. EXAM: PORTABLE PELVIS 1-2 VIEWS COMPARISON:  October 11, 2020. FINDINGS: The right acetabular and femoral components appear to be well situated. Expected postoperative changes are noted in the surrounding soft tissues IMPRESSION: Status post right total hip arthroplasty. Electronically Signed   By: Marijo Conception M.D.   On: 11/14/2020 14:46   DG C-Arm 1-60 Min-No Report  Result Date: 11/14/2020 Fluoroscopy was utilized by the requesting physician.  No radiographic interpretation.   DG HIP OPERATIVE UNILAT W OR W/O PELVIS RIGHT  Result Date: 11/14/2020 CLINICAL DATA:  Right anterior total hip replacement. EXAM: OPERATIVE RIGHT HIP (WITH PELVIS IF PERFORMED) 2 VIEWS TECHNIQUE:  Fluoroscopic spot image(s) were submitted for interpretation post-operatively. FLUOROSCOPY TIME:  25 seconds COMPARISON:  Right hip radiographs-02/25/2020 FINDINGS: 2 spot intraoperative fluoroscopic images of the right hip and lower pelvis are provided for review Provided images demonstrate the sequela of right total hip replacement. Alignment appears anatomic  given AP projection. No definite fracture. There is a minimal amount of expected subcutaneous emphysema about the operative site. The mid aspect of the presumed cardiac lead overlies the midline of the abdomen. No radiopaque foreign body. Limited visualization of the pelvis suggests mild to moderate degenerative change of the contralateral left hip, incompletely evaluated. Dystrophic calcifications overlies expected location of the prostate gland. IMPRESSION: Post right total hip replacement without evidence of complication. Electronically Signed   By: Sandi Mariscal M.D.   On: 11/14/2020 13:34    Disposition: Discharge disposition: 01-Home or Self Care          Follow-up Information    Leandrew Koyanagi, MD In 2 weeks.   Specialty: Orthopedic Surgery Why: For suture removal, For wound re-check Contact information: [place] [phone]                Signed: Aundra Dubin 11/15/2020, 7:46 AM

## 2020-11-16 ENCOUNTER — Telehealth: Payer: Self-pay | Admitting: Family Medicine

## 2020-11-16 NOTE — Telephone Encounter (Signed)
Please call patient and tell him that this should be obtained from his orthopedic surgeon.

## 2020-11-16 NOTE — Telephone Encounter (Signed)
Left detailed msg for pt to contact surgeon for placard.

## 2020-11-16 NOTE — Telephone Encounter (Signed)
Pt called in wanted to know about getting a handicap sticker due to he just had surgery on Monday

## 2020-11-17 ENCOUNTER — Telehealth: Payer: Self-pay | Admitting: Orthopaedic Surgery

## 2020-11-17 NOTE — Telephone Encounter (Signed)
Pt called asking if he can obtain a handy cap placard

## 2020-11-17 NOTE — Telephone Encounter (Signed)
Sure

## 2020-11-17 NOTE — Telephone Encounter (Signed)
Lvm informing pt it was ready for pick up at the front desk

## 2020-11-17 NOTE — Telephone Encounter (Signed)
Is this ok?

## 2020-11-29 ENCOUNTER — Other Ambulatory Visit: Payer: Self-pay

## 2020-11-29 ENCOUNTER — Ambulatory Visit (INDEPENDENT_AMBULATORY_CARE_PROVIDER_SITE_OTHER): Payer: 59 | Admitting: Physician Assistant

## 2020-11-29 ENCOUNTER — Encounter: Payer: Self-pay | Admitting: Orthopaedic Surgery

## 2020-11-29 DIAGNOSIS — Z96641 Presence of right artificial hip joint: Secondary | ICD-10-CM

## 2020-11-29 NOTE — Progress Notes (Signed)
Post-Op Visit Note   Patient: Rodney Dougherty           Date of Birth: 09/15/1956           MRN: [id number] Visit Date: 11/29/2020 PCP: Emi Belfast, FNP   Assessment & Plan:  Chief Complaint:  Chief Complaint  Patient presents with  . Right Hip - Pain   Visit Diagnoses:  1. Status post total hip replacement, right     Plan: Patient is a pleasant 64 year old gentleman who comes in today 2 weeks out right total hip replacement.  He has been doing very well.  He has been getting home health physical therapy and he is ambulating with a cane.  He has minimal pain which is relieved with Tylenol.  Examination of his right hip reveals a well-healed surgical incision without complication.  Calf is soft nontender.  He is neurovascular intact distally.  At this point, Steri-Strips were applied.  The patient will continue with his remaining 2 sessions of home health physical therapy and have sent in a referral for outpatient physical therapy based on PT recommendations.  He will follow up with [place] in 4 weeks time for repeat evaluation and AP pelvis x-rays.  Call with concerns or questions.  Follow-Up Instructions: Return in about 4 weeks (around 12/27/2020).   Orders:  No orders of the defined types were placed in this encounter.  No orders of the defined types were placed in this encounter.   Imaging: No new imaging  PMFS History: Patient Active Problem List   Diagnosis Date Noted  . Status post total replacement of right hip 11/14/2020  . Prediabetes 08/19/2020  . Hypercholesteremia 08/19/2020  . Primary osteoarthritis of right hip 02/25/2020   Past Medical History:  Diagnosis Date  . Arthritis   . Hyperlipidemia   . Pre-diabetes   . Skin cancer    Face  . Sleep apnea    Does not wear CPAP    Family History  Problem Relation Age of Onset  . Stroke Mother   . Colon cancer Neg Hx   . Colon polyps Neg Hx   . Esophageal cancer Neg Hx   . Stomach cancer Neg Hx   .  Rectal cancer Neg Hx     Past Surgical History:  Procedure Laterality Date  . BACK SURGERY     pt. reports it was about 20 yrs. ago, no problems since then    . HAND TENDON SURGERY Left 1999   occupational injury involving artery as well  . HERNIA REPAIR Left 1975   inguinal   . INGUINAL HERNIA REPAIR Left 04/16/2017   Procedure: LAPAROSCOPIC LEFT INGUINAL HERNIA REPAIR;  Surgeon: Berna Bue, MD;  Location: [facility] OR;  Service: General;  Laterality: Left;  . INSERTION OF MESH Left 04/16/2017   Procedure: INSERTION OF MESH;  Surgeon: Berna Bue, MD;  Location: [facility] OR;  Service: General;  Laterality: Left;  . TOTAL HIP ARTHROPLASTY Right 11/14/2020   Procedure: RIGHT TOTAL HIP ARTHROPLASTY ANTERIOR APPROACH;  Surgeon: Tarry Kos, MD;  Location: [facility] ORS;  Service: Orthopedics;  Laterality: Right;  request 3C bed   Social History   Occupational History  . Occupation: Full time  Tobacco Use  . Smoking status: Never Smoker  . Smokeless tobacco: Never Used  Vaping Use  . Vaping Use: Never used  Substance and Sexual Activity  . Alcohol use: Yes    Comment: rarely  . Drug use: No  .  Sexual activity: Not on file

## 2020-12-08 ENCOUNTER — Ambulatory Visit: Payer: 59 | Attending: Physician Assistant | Admitting: Physical Therapy

## 2020-12-08 ENCOUNTER — Other Ambulatory Visit: Payer: Self-pay

## 2020-12-08 DIAGNOSIS — R262 Difficulty in walking, not elsewhere classified: Secondary | ICD-10-CM | POA: Insufficient documentation

## 2020-12-08 DIAGNOSIS — M25551 Pain in right hip: Secondary | ICD-10-CM | POA: Insufficient documentation

## 2020-12-08 DIAGNOSIS — M6281 Muscle weakness (generalized): Secondary | ICD-10-CM | POA: Diagnosis present

## 2020-12-08 DIAGNOSIS — R252 Cramp and spasm: Secondary | ICD-10-CM | POA: Diagnosis present

## 2020-12-08 NOTE — Patient Instructions (Signed)
     Garen Lah, PT, [facility] Certified Exercise Expert for the Aging Adult  12/08/20 10:54 AM Phone: [phone] Fax: [phone]

## 2020-12-08 NOTE — Therapy (Signed)
[place], [place], [address] Phone: [phone]   Fax:  [phone]  Physical Therapy Evaluation  Patient Details  Name: Rodney Dougherty MRN: [id number] Date of Birth: Apr 28, 1956 Referring Provider (PT): Frankey Shown, MD   Encounter Date: 12/08/2020   PT End of Session - 12/08/20 1017    Visit Number 1    Number of Visits 16    Date for PT Re-Evaluation 02/02/21    Authorization Type [employer]    PT Start Time 1017    PT Stop Time 1104    PT Time Calculation (min) 47 min    Activity Tolerance Patient tolerated treatment well    Behavior During Therapy [facility] for tasks assessed/performed           Past Medical History:  Diagnosis Date  . Arthritis   . Hyperlipidemia   . Pre-diabetes   . Skin cancer    Face  . Sleep apnea    Does not wear CPAP    Past Surgical History:  Procedure Laterality Date  . BACK SURGERY     pt. reports it was about 20 yrs. ago, no problems since then    . HAND TENDON SURGERY Left 1999   occupational injury involving artery as well  . HERNIA REPAIR Left 1975   inguinal   . INGUINAL HERNIA REPAIR Left 04/16/2017   Procedure: LAPAROSCOPIC LEFT INGUINAL HERNIA REPAIR;  Surgeon: Clovis Riley, MD;  Location: [facility];  Service: General;  Laterality: Left;  . INSERTION OF MESH Left 04/16/2017   Procedure: INSERTION OF MESH;  Surgeon: Clovis Riley, MD;  Location: [facility];  Service: General;  Laterality: Left;  . TOTAL HIP ARTHROPLASTY Right 11/14/2020   Procedure: RIGHT TOTAL HIP ARTHROPLASTY ANTERIOR APPROACH;  Surgeon: Leandrew Koyanagi, MD;  Location: [facility];  Service: Orthopedics;  Laterality: Right;  request 3C bed    There were no vitals filed for this visit.    Subjective Assessment - 12/08/20 1048    Subjective I had my surgery on 11-14-20 and I can use my cane when I am out in the  public,  I dont use as much at home.  If I go outside to walk dog.  I use my cane  on the steps. Pain and discomfort keeps me up at night  I am getting 2-4 hours of sleep a night now    Pertinent History back surgery 20 yr , tendon repair in hand 30 years ago    How long can you sit comfortably? unlimited    How long can you stand comfortably? I move all the time,  I can stand 15 minutes    How long can you walk comfortably? I walk al lthe time    Diagnostic tests xray, MRI    Patient Stated Goals I want to tie my own shoes,    Currently in Pain? Yes    Pain Score 2     Pain Location Hip    Pain Orientation Right    Pain Descriptors / Indicators Aching    Pain Type Surgical pain    Pain Onset 1 to 4 weeks ago    Pain Frequency Intermittent    Aggravating Factors  when you are tryin to tie shoes or bend hip.  just when I over work    Multiple Pain Sites Yes              [facility] PT Assessment - 12/08/20  0001      Assessment   Medical Diagnosis R THA anterior approach    Referring Provider (PT) Frankey Shown, MD    Onset Date/Surgical Date 11/14/20   R THA surgery   Hand Dominance Right    Prior Therapy HHPT 5 sessions      Precautions   Precautions Anterior Hip      Restrictions   Weight Bearing Restrictions Yes      Balance Screen   Has the patient fallen in the past 6 months No    Has the patient had a decrease in activity level because of a fear of falling?  No    Is the patient reluctant to leave their home because of a fear of falling?  No      Home Environment   Living Environment Private residence    Living Arrangements Children    Type of [place] Access Stairs to enter    Entrance Stairs-Number of Steps 5    Entrance Stairs-Rails Can reach both    [place] One level      Prior Function   Level of Independence Independent      Cognition   Overall Cognitive Status Within Functional Limits for tasks assessed      Observation/Other Assessments   Focus on Therapeutic Outcomes (FOTO)  FOTO intake 71%  predicted 82%       Sensation   Light Touch Appears Intact    Additional Comments just numbness around incision      Functional Tests   Functional tests Sit to Stand;Squat;Lunges;Single leg stance      Squat   Comments able to squat with chair in back today wt shifts to the Left      Lunges   Comments unable at this point in recovery      Single Leg Stance   Comments R 2 sec   and L 15 sec      Sit to Stand   Comments 5 x STS 23.43      ROM / Strength   AROM / PROM / Strength AROM;Strength      AROM   Overall AROM  Deficits    Right Hip Extension 0    Right Hip Flexion 90    Left Hip Extension 30    Left Hip Flexion 120      Strength   Overall Strength Deficits    Right Hip Flexion 4/5    Right Hip Extension 4/5    Right Hip ABduction 4/5    Left Hip Flexion 5/5    Left Hip Extension 4+/5    Left Hip ABduction 4+/5    Right Knee Flexion 5/5    Right Knee Extension 5/5    Left Knee Flexion 5/5    Left Knee Extension 5/5      Palpation   Palpation comment numbess around incision      Ambulation/Gait   Gait Pattern Antalgic;Decreased stance time - right    Ambulation Surface Level    Stairs Yes    Stairs Assistance 6: Modified independent (Device/Increase time)   cane   Stair Management Technique No rails    Number of Stairs 4   up and down   Height of Stairs 6    Gait Comments Pt able to negotiate steps alternating steps                      Objective measurements completed on examination:  See above findings.               PT Education - 12/08/20 1054    Education Details POC Explanation of findings and progression.  Initial HEP Went over FOTO report    Person(s) Educated Patient    Methods Explanation;Demonstration;Tactile cues;Verbal cues;Handout    Comprehension Verbalized understanding;Returned demonstration            PT Short Term Goals - 12/08/20 1058      PT SHORT TERM GOAL #1   Title Pt will be independent with iniital HEP     Baseline limited knowledge  has had HHPT    Time 4    Period Weeks    Status New    Target Date 01/05/21      PT SHORT TERM GOAL #2   Title Pt will decrease pain by 25 % in order to sleep for 4 or more hours of continues sleep    Baseline sleeping 2-4 hours a night now    Time 4    Period Weeks    Status New    Target Date 01/05/21      PT SHORT TERM GOAL #3   Title Pt sit to stand with even wt distribution    Baseline Pt wt shifts to LT    Time 4    Period Weeks    Status New    Target Date 01/05/21             PT Long Term Goals - 12/08/20 1102      PT LONG TERM GOAL #1   Title Pt will be independent with advanced HEP given    Baseline limited knowledge of exericses    Time 8    Period Weeks    Status New    Target Date 02/02/21      PT LONG TERM GOAL #2   Title Pt will tolerate standing and walking in order to return to active lifestyle and walk 3 miles for exercises without limping and no AD    Baseline Pt walking all the time but with antalgic gait and using cane    Time 8    Period Weeks    Status New    Target Date 02/02/21      PT LONG TERM GOAL #3   Title FOTO will improve from 71 % intake to 82% intake indicating improved functinal mobility    Baseline eval intake 71%    Time 8    Period Weeks    Status New    Target Date 02/02/21      PT LONG TERM GOAL #4   Title Pt will deomstrate floor to stand transfer in order to perform household/yard work and enhance fall preparedness    Baseline unable to perform at this time due to 2 weeks post THA  Right surgery 11-14-20    Time 8    Period Weeks    Status New    Target Date 02/02/21      PT LONG TERM GOAL #5   Title Pt will be able to demonstrate deadlifting 50# in order to return to yard work and lifting lumbar without pain    Baseline Pt unable to lift at this time due to recent surgery    Time 8    Period Weeks    Status New    Target Date 02/02/21      Additional Long Term Goals    Additional Long Term Goals Yes  PT LONG TERM GOAL #6   Title Pt will be able to don socks and tie shoes easily and independently    Baseline Pt unable to put socks on R foot without a sock aid    Time 8    Period Weeks    Status New    Target Date 02/02/21                  Plan - 12/08/20 1048    Clinical Impression Statement 65 year old male s/p R  THA on   11-14-20 by Dr Frankey Shown   . Pt presents with impairments including pain, hip weaknes and decreased ROM, difficulty with walking without limping, Pt would like to be able to put socks on shoes on without assistive device. Pt also would like to return to working in the yard and carrying heavy items without limping. . Pt would benefit from skilled PT for 2 times a week for 8 weeks to address above impariments and functional limitations and return to pain-free PLOF.    Personal Factors and Comorbidities Comorbidity 1    Comorbidities arthritis, sleep apnea, hyperlipidemia, back surgery 20 years ago    Examination-Activity Limitations Squat;Stand;Locomotion Level;Sleep;Other   floor to stand transfer   Examination-Participation Restrictions Yard Work    Stability/Clinical Decision Making Evolving/Moderate complexity    Clinical Decision Making Moderate    Rehab Potential Good    PT Frequency 2x / week    PT Duration 8 weeks    PT Treatment/Interventions ADLs/Self Care Home Management;Cryotherapy;Electrical Stimulation;Iontophoresis 4mg /ml Dexamethasone;Moist Heat;Gait training;DME Instruction;Stair training;Functional mobility training;Therapeutic activities;Therapeutic exercise;Balance training;Neuromuscular re-education;Manual techniques;Patient/family education;Dry needling;Passive range of motion;Scar mobilization;Taping    PT Next Visit Plan reviewe HEP and progress gait toward decreasing cane use as able without antalgic gait    PT Home Exercise Plan [email or web address]    Consulted and Agree with Plan of Care Patient            Patient will benefit from skilled therapeutic intervention in order to improve the following deficits and impairments:  Difficulty walking,Decreased mobility,Decreased range of motion,Decreased scar mobility,Decreased strength,Increased muscle spasms,Impaired flexibility,Pain  Visit Diagnosis: Pain in right hip - Plan: PT plan of care cert/re-cert  Muscle weakness (generalized) - Plan: PT plan of care cert/re-cert  Difficulty in walking, not elsewhere classified - Plan: PT plan of care cert/re-cert  Cramp and spasm - Plan: PT plan of care cert/re-cert   Access Code: [email or web address]: [email or web address]: 01/06/2022Prepared by: Donnetta Simpers BeardsleyExercises  Sidelying Hip Abduction - 1 x daily - 7 x weekly - 3 sets - 10 reps  Supine Single Bent Knee Fallout - 1 x daily - 7 x weekly - 3 sets - 10 reps  Long Arc Quad - 1 x daily - 7 x weekly - 3 sets - 10 reps  Standing Hip Abduction - 1 x daily - 7 x weekly - 3 sets - 10 reps  Quadriceps Stretch with Chair - 1 x daily - 7 x weekly - 1 sets - 3 reps - 30-60 sec hold  Sit to Stand with Hands on Knees - 1 x daily - 7 x weekly - 3 sets - 10 reps    Problem List Patient Active Problem List   Diagnosis Date Noted  . Status post total replacement of right hip 11/14/2020  . Prediabetes 08/19/2020  . Hypercholesteremia 08/19/2020  . Primary osteoarthritis of right hip 02/25/2020    Rodney Dougherty, PT, [place] Certified Exercise Expert for the Aging Adult  12/08/20  12:52 PM Phone: [phone] Fax: [place] [facility] [address] [place], [place], [address] Phone: [phone]   Fax:  [phone]  Name: Rodney Dougherty MRN: [id number] Date of Birth: [birth year]

## 2020-12-21 ENCOUNTER — Other Ambulatory Visit: Payer: Self-pay

## 2020-12-21 ENCOUNTER — Encounter: Payer: Self-pay | Admitting: Physical Therapy

## 2020-12-21 ENCOUNTER — Ambulatory Visit: Payer: 59 | Admitting: Physical Therapy

## 2020-12-21 DIAGNOSIS — M25551 Pain in right hip: Secondary | ICD-10-CM | POA: Diagnosis not present

## 2020-12-21 DIAGNOSIS — M6281 Muscle weakness (generalized): Secondary | ICD-10-CM

## 2020-12-21 DIAGNOSIS — R262 Difficulty in walking, not elsewhere classified: Secondary | ICD-10-CM

## 2020-12-21 DIAGNOSIS — R252 Cramp and spasm: Secondary | ICD-10-CM
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
## 2020-12-23 ENCOUNTER — Encounter: Payer: 59 | Admitting: Physical Therapy

## 2020-12-27 ENCOUNTER — Ambulatory Visit (INDEPENDENT_AMBULATORY_CARE_PROVIDER_SITE_OTHER): Payer: 59

## 2020-12-27 ENCOUNTER — Other Ambulatory Visit: Payer: Self-pay

## 2020-12-27 ENCOUNTER — Ambulatory Visit (INDEPENDENT_AMBULATORY_CARE_PROVIDER_SITE_OTHER): Payer: 59 | Admitting: Orthopaedic Surgery

## 2020-12-27 DIAGNOSIS — Z96641 Presence of right artificial hip joint: Secondary | ICD-10-CM | POA: Diagnosis not present
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
## 2020-12-28 ENCOUNTER — Ambulatory Visit: Payer: 59 | Admitting: Physical Therapy

## 2020-12-30 ENCOUNTER — Ambulatory Visit: Payer: 59 | Admitting: Physical Therapy

## 2020-12-30 ENCOUNTER — Other Ambulatory Visit: Payer: Self-pay

## 2020-12-30 ENCOUNTER — Encounter: Payer: Self-pay | Admitting: Physical Therapy

## 2020-12-30 DIAGNOSIS — R262 Difficulty in walking, not elsewhere classified: Secondary | ICD-10-CM

## 2020-12-30 DIAGNOSIS — M25551 Pain in right hip: Secondary | ICD-10-CM

## 2020-12-30 DIAGNOSIS — M6281 Muscle weakness (generalized): Secondary | ICD-10-CM

## 2020-12-30 DIAGNOSIS — R252 Cramp and spasm: Secondary | ICD-10-CM
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
## 2021-01-03 ENCOUNTER — Ambulatory Visit: Payer: 59 | Admitting: Physical Therapy

## 2021-01-05 ENCOUNTER — Encounter: Payer: Self-pay | Admitting: Physical Therapy

## 2021-01-05 ENCOUNTER — Other Ambulatory Visit: Payer: Self-pay

## 2021-01-05 ENCOUNTER — Ambulatory Visit: Payer: 59 | Attending: Physician Assistant | Admitting: Physical Therapy

## 2021-01-05 DIAGNOSIS — M6281 Muscle weakness (generalized): Secondary | ICD-10-CM | POA: Insufficient documentation

## 2021-01-05 DIAGNOSIS — M25551 Pain in right hip: Secondary | ICD-10-CM | POA: Diagnosis not present

## 2021-01-05 DIAGNOSIS — R262 Difficulty in walking, not elsewhere classified: Secondary | ICD-10-CM | POA: Diagnosis present

## 2021-01-05 DIAGNOSIS — R252 Cramp and spasm: Secondary | ICD-10-CM | POA: Insufficient documentation

## 2021-01-05 NOTE — Patient Instructions (Signed)
      Complete modified version of HEP.   Please also walk at least  300 min a week  An hour 5 days a week.   For good health at your level   Minimum is 150 minutes a week.  Voncille Lo, PT, [place] Certified Exercise Expert for the Aging Adult  01/05/21 8:27 AM Phone: [phone] Fax: [phone]

## 2021-01-05 NOTE — Therapy (Signed)
[place], [place], [address] Phone: [phone]   Fax:  [phone]  Physical Therapy Treatment/Discharge  Patient Details  Name: Rodney Dougherty MRN: [id number] Date of Birth: 17-May-1956 Referring Provider (PT): Frankey Shown, MD   Encounter Date: 01/05/2021   PT End of Session - 01/05/21 0803    Visit Number 4    Number of Visits 16    Date for PT Re-Evaluation 02/02/21    Authorization Type [employer]    PT Start Time 0800    PT Stop Time 0832    PT Time Calculation (min) 32 min    Activity Tolerance Patient tolerated treatment well    Behavior During Therapy [facility] for tasks assessed/performed           Past Medical History:  Diagnosis Date  . Arthritis   . Hyperlipidemia   . Pre-diabetes   . Skin cancer    Face  . Sleep apnea    Does not wear CPAP    Past Surgical History:  Procedure Laterality Date  . BACK SURGERY     pt. reports it was about 20 yrs. ago, no problems since then    . HAND TENDON SURGERY Left 1999   occupational injury involving artery as well  . HERNIA REPAIR Left 1975   inguinal   . INGUINAL HERNIA REPAIR Left 04/16/2017   Procedure: LAPAROSCOPIC LEFT INGUINAL HERNIA REPAIR;  Surgeon: Clovis Riley, MD;  Location: [place];  Service: General;  Laterality: Left;  . INSERTION OF MESH Left 04/16/2017   Procedure: INSERTION OF MESH;  Surgeon: Clovis Riley, MD;  Location: [place];  Service: General;  Laterality: Left;  . TOTAL HIP ARTHROPLASTY Right 11/14/2020   Procedure: RIGHT TOTAL HIP ARTHROPLASTY ANTERIOR APPROACH;  Surgeon: Leandrew Koyanagi, MD;  Location: [facility];  Service: Orthopedics;  Laterality: Right;  request 3C bed    There were no vitals filed for this visit.   Subjective Assessment - 01/05/21 0801    Subjective No pain   No pain going up and down steps  and I can go up and down my ladder at work.    Pertinent History back surgery 20 yr , tendon repair in  hand 30 years ago    How long can you sit comfortably? unlimited    How long can you stand comfortably? unlimited   can stand for 6 hours    How long can you walk comfortably? unlimited    Diagnostic tests xray, MRI    Patient Stated Goals I want to tie my own shoes,    Currently in Pain? No/denies    Pain Location Hip              [facility] PT Assessment - 01/05/21 0001      Assessment   Medical Diagnosis R THA anterior approach    Referring Provider (PT) Frankey Shown, MD    Onset Date/Surgical Date 11/14/20   R THA surgery   Hand Dominance Right      Observation/Other Assessments   Focus on Therapeutic Outcomes (FOTO)  improved from 71 to 86   taken 12/30/20     AROM   Right Hip Extension 25    Right Hip Flexion 111      Strength   Overall Strength Deficits    Right Hip Flexion 5/5    Right Hip Extension 5/5    Right Hip ABduction 5/5    Left Hip Flexion  5/5    Left Hip Extension 5/5    Left Hip ABduction 5/5    Right Knee Flexion 5/5    Right Knee Extension 5/5    Left Knee Flexion 5/5    Left Knee Extension 5/5      Palpation   Palpation comment well healed THA scar R      Ambulation/Gait   Number of Stairs 16    Height of Stairs 6    Gait Comments able to take stairs two steps at a time and no pain and normal steps with equal step length                         [facility] Adult PT Treatment/Exercise - 01/05/21 0001      Self-Care   Self-Care Other Self-Care Comments;Lifting    Lifting demo of lifting and technique and lifting of 63# demo, 45# and 25 #    Other Self-Care Comments  walking program and minimum exercise requirements      Knee/Hip Exercises: Standing   Other Standing Knee Exercises deadlift 25# x 10, 45# x 10 and 63# x 10,  goblet squat 25# x 10  hip hinge education and demo with dowel and ext cue ow wall    Other Standing Knee Exercises reviewed HEP  with standing quad stretch  standing hip abduction  added lateral step down , runnerps  step goblet squat x 10 with 25# hip hiking on step, right and Left lunge to floor and standing.  Pt independent in rising and descending to the floor with no pain in hip      Knee/Hip Exercises: Supine   Bridges 2 sets;10 reps                  PT Education - 01/05/21 0802    Education Details Reviewed lifting  and injury prevention, revised/ revieweed  HEP and discussed walking program and exercise/ good health requirements ( 150 min to 300 min moderate exercise    Person(s) Educated Patient    Methods Explanation;Demonstration;Tactile cues;Verbal cues;Handout    Comprehension Verbalized understanding;Returned demonstration;Verbal cues required            PT Short Term Goals - 12/30/20 1023      PT SHORT TERM GOAL #1   Title Pt will be independent with iniital HEP    Baseline did more HEP when he was out of work but not as much now that he is working    Time 4    Period Weeks    Status Achieved      PT SHORT TERM GOAL #2   Title Pt will decrease pain by 25 % in order to sleep for 4 or more hours of continues sleep    Baseline no limitation    Time 4    Period Weeks    Status Achieved    Target Date 01/05/21      PT SHORT TERM GOAL #3   Title Pt sit to stand with even wt distribution    Time 4    Period Weeks    Status Achieved             PT Long Term Goals - 01/05/21 3825      PT LONG TERM GOAL #1   Title Pt will be independent with advanced HEP given    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  Time 8    Period Weeks    Status Achieved      PT LONG TERM GOAL #2   Title Pt will tolerate standing and walking in order to return to active lifestyle and walk 3 miles for exercises without limping and no AD    Baseline Pt has umlmitied walking now    Time 8    Period Weeks    Status Achieved      PT LONG TERM GOAL #3   Title FOTO will improve from 71 % intake to 82% intake indicating improved functinal  mobility    Baseline DC 86%    Time 8    Period Weeks    Status Achieved      PT LONG TERM GOAL #4   Title Pt will deomstrate floor to stand transfer in order to perform household/yard work and enhance fall preparedness    Time 8    Period Weeks    Status Achieved      PT LONG TERM GOAL #5   Title Pt will be able to demonstrate deadlifting 50# in order to return to yard work and lifting lumbar without pain    Baseline complete 45# x 10 and 63# in clinic x 10 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  Pt was educated on injury prevention and proper lifing of heavy items with demo  Pt encouraged to walk 300 min a week for health and continuing progress.   FOTO score 86% exceeding expectation.  Pt AROM hip flex 111, hip ext R 25. Pt able to I lunge to floor and stand independently.  Pt is ready for DC with modified for strength HEP    Personal Factors and Comorbidities Comorbidity 1    Comorbidities arthritis, sleep apnea, hyperlipidemia, back surgery 20 years ago    Examination-Activity Limitations Squat;Stand;Locomotion Level;Sleep;Other    PT Frequency 2x / week    PT Duration 8 weeks    PT Treatment/Interventions ADLs/Self Care Home Management;Cryotherapy;Electrical Stimulation;Iontophoresis 4mg /ml Dexamethasone;Moist Heat;Gait training;DME Instruction;Stair training;Functional mobility training;Therapeutic  activities;Therapeutic exercise;Balance training;Neuromuscular re-education;Manual techniques;Patient/family education;Dry needling;Passive range of motion;Scar mobilization;Taping    PT Home Exercise Plan [email or web address]    Consulted and Agree with Plan of Care Patient           Patient will benefit from skilled therapeutic intervention in order to improve the following deficits and impairments:  Difficulty walking,Decreased mobility,Decreased range of motion,Decreased scar mobility,Decreased strength,Increased muscle spasms,Impaired flexibility,Pain  Visit Diagnosis: Pain in right hip  Muscle weakness (generalized)  Difficulty in walking, not elsewhere classified  Cramp and spasm Access Code: [email or web address]: [email or web address]: 02/03/2022Prepared by: Donnetta Simpers BeardsleyExercises  Standing Hip Abduction - 1 x  daily - 7 x weekly - 3 sets - 10 reps  Quadriceps Stretch with Chair - 1 x daily - 7 x weekly - 1 sets - 3 reps - 30-60 sec hold  Half Kneeling Hip Flexor Stretch - 1 x daily - 7 x weekly - 10 reps - 3 sets  Squat with Chair Support - 1 x daily - 7 x weekly - 10 reps - 3 sets  Knee Flexion Strengthening with Ankle Weight - 1 x daily - 7 x weekly - 10 reps - 3 sets  Lateral Step Down - 1 x daily - 7 x weekly - 3 sets - 10 reps  Runner's Step Up/Down - 1 x daily - 7 x weekly - 10 reps - 3 sets  Goblet Squat with Kettlebell - 1 x daily - 7 x weekly - 3 sets - 10 reps  Hip Hiking on Step - 1 x daily - 7 x weekly - 10 reps - 3 sets  Standing Hip Hinge with Dowel - 1 x daily - 7 x weekly - 10 reps - 3 sets  Kettlebell Deadlift - 1 x daily - 7 x weekly - 3 sets     Problem List Patient Active Problem List   Diagnosis Date Noted  . Status post total replacement of right hip 11/14/2020  . Prediabetes 08/19/2020  . Hypercholesteremia 08/19/2020  . Primary osteoarthritis of right hip 02/25/2020   Voncille Lo, PT, [facility] Certified Exercise Expert for the Aging Adult   01/05/21 10:11 AM Phone: [phone] Fax: [place] [facility] [address] [place], [place], [address] Phone: [phone]   Fax:  [phone]  Name: ELBA DENDINGER MRN: [id number] Date of Birth: 04-Jul-1956   PHYSICAL THERAPY DISCHARGE SUMMARY  Visits from Start of Care: 4  Current functional level related to goals / functional outcomes: As above   Remaining deficits: none   Education / Equipment: HEP modified for strength Plan: Patient agrees to discharge.  Patient goals were met. Patient is being discharged due to meeting the stated rehab goals.  ?????    And being pleased with current level of function exceeding expectations Voncille Lo, PT, [place] Certified Exercise Expert for the Aging Adult  01/05/21 10:13 AM Phone: [phone] Fax: [phone]

## 2021-01-10 ENCOUNTER — Ambulatory Visit: Payer: 59 | Admitting: Physical Therapy

## 2021-01-12 ENCOUNTER — Ambulatory Visit: Payer: 59 | Admitting: Physical Therapy

## 2021-01-17 ENCOUNTER — Encounter: Payer: 59 | Admitting: Physical Therapy

## 2021-01-19 ENCOUNTER — Encounter: Payer: 59 | Admitting: Physical Therapy

## 2021-01-24 ENCOUNTER — Encounter: Payer: 59 | Admitting: Physical Therapy

## 2021-01-26 ENCOUNTER — Encounter: Payer: 59 | Admitting: Physical Therapy

## 2021-01-31 ENCOUNTER — Encounter: Payer: 59 | Admitting: Physical Therapy

## 2021-02-02 ENCOUNTER — Encounter: Payer: 59 | Admitting: Physical Therapy

## 2021-02-07 ENCOUNTER — Ambulatory Visit (INDEPENDENT_AMBULATORY_CARE_PROVIDER_SITE_OTHER): Payer: 59 | Admitting: Orthopaedic Surgery

## 2021-02-07 ENCOUNTER — Encounter: Payer: 59 | Admitting: Physical Therapy

## 2021-02-07 ENCOUNTER — Encounter: Payer: Self-pay | Admitting: Orthopaedic Surgery

## 2021-02-07 VITALS — Ht 69.0 in | Wt 193.0 lb

## 2021-02-07 DIAGNOSIS — Z96641 Presence of right artificial hip joint: Secondary | ICD-10-CM

## 2021-02-07 NOTE — Progress Notes (Signed)
   Post-Op Visit Note   Patient: Rodney Dougherty           Date of Birth: 04-May-1956           MRN: [id number] Visit Date: 02/07/2021 PCP: Elby Beck, FNP (Inactive)   Assessment & Plan:  Chief Complaint:  Chief Complaint  Patient presents with  . Right Hip - Follow-up    Right total hip arthroplasty 11/14/2020   Visit Diagnoses:  1. Status post total replacement of right hip     Plan:   Rodney Dougherty is 65-month status post right total hip replacement.  Doing well overall.  Very happy.  Back to work.  No complaints.  Surgical scar fully healed.  Ambulating normally.  Full range of motion of the hip.  He is doing extremely well.  We will see him back in 9 months with standing AP pelvis x-rays.  Questions encouraged and answered.  Follow-Up Instructions: Return in about 9 months (around 11/09/2021).   Orders:  No orders of the defined types were placed in this encounter.  No orders of the defined types were placed in this encounter.   Imaging: No results found.  PMFS History: Patient Active Problem List   Diagnosis Date Noted  . Status post total replacement of right hip 11/14/2020  . Prediabetes 08/19/2020  . Hypercholesteremia 08/19/2020  . Primary osteoarthritis of right hip 02/25/2020   Past Medical History:  Diagnosis Date  . Arthritis   . Hyperlipidemia   . Pre-diabetes   . Skin cancer    Face  . Sleep apnea    Does not wear CPAP    Family History  Problem Relation Age of Onset  . Stroke Mother   . Colon cancer Neg Hx   . Colon polyps Neg Hx   . Esophageal cancer Neg Hx   . Stomach cancer Neg Hx   . Rectal cancer Neg Hx     Past Surgical History:  Procedure Laterality Date  . BACK SURGERY     pt. reports it was about 20 yrs. ago, no problems since then    . HAND TENDON SURGERY Left 1999   occupational injury involving artery as well  . HERNIA REPAIR Left 1975   inguinal   . INGUINAL HERNIA REPAIR Left 04/16/2017   Procedure: LAPAROSCOPIC  LEFT INGUINAL HERNIA REPAIR;  Surgeon: Clovis Riley, MD;  Location: [place];  Service: General;  Laterality: Left;  . INSERTION OF MESH Left 04/16/2017   Procedure: INSERTION OF MESH;  Surgeon: Clovis Riley, MD;  Location: [place];  Service: General;  Laterality: Left;  . TOTAL HIP ARTHROPLASTY Right 11/14/2020   Procedure: RIGHT TOTAL HIP ARTHROPLASTY ANTERIOR APPROACH;  Surgeon: Leandrew Koyanagi, MD;  Location: [facility];  Service: Orthopedics;  Laterality: Right;  request 3C bed   Social History   Occupational History  . Occupation: Full time  Tobacco Use  . Smoking status: Never Smoker  . Smokeless tobacco: Never Used  Vaping Use  . Vaping Use: Never used  Substance and Sexual Activity  . Alcohol use: Yes    Comment: rarely  . Drug use: No  . Sexual activity: Not on file

## 2021-02-09 ENCOUNTER — Encounter: Payer: 59 | Admitting: Physical Therapy

## 2021-02-14 ENCOUNTER — Encounter: Payer: 59 | Admitting: Physical Therapy

## 2021-02-16 ENCOUNTER — Encounter: Payer: 59 | Admitting: Physical Therapy

## 2021-02-21 ENCOUNTER — Encounter: Payer: 59 | Admitting: Physical Therapy

## 2021-02-23 ENCOUNTER — Encounter: Payer: 59 | Admitting: Physical Therapy

## 2021-02-28 ENCOUNTER — Encounter: Payer: 59 | Admitting: Physical Therapy

## 2021-03-01 ENCOUNTER — Ambulatory Visit: Payer: Self-pay | Admitting: General Surgery

## 2021-03-01 NOTE — H&P (Signed)
History of Present Illness Ralene Ok MD; 03/01/2021 9:27 AM) The patient is a 65 year old male who presents with an inguinal hernia. patient is a 65 year old male, comes in secondary to a recurrent left inguinal hernia. Patient had this repaired in May 2018 by Dr. Kae Heller. He states that he has noticed a bulge to the left inguinal area. 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 Patient states she's not a smoker. This would be a second recurrence. The previous hernia repairs for years ago he does feel that there was mesh placed at that time.     Past Surgical History Lindwood Coke, RN; 03/01/2021 9:14 AM) Hip Surgery  Right.  Diagnostic Studies History Lindwood Coke, RN; 03/01/2021 9:14 AM) Colonoscopy  within last year 5-10 years ago  Allergies Lindwood Coke, RN; 03/01/2021 9:14 AM) No Known Drug Allergies  [03/07/2017]: Allergies Reconciled   Medication History (Diane Herrin, RN; 03/01/2021 9:15 AM) Rosuvastatin Calcium (10MG  Tablet, Oral) Active. Aspirin (81MG  Tablet Chewable, Oral) Active. Medications Reconciled  Social History Lindwood Coke, RN; 03/01/2021 9:14 AM) Caffeine use  Carbonated beverages, Coffee, Tea. No alcohol use  No drug use  Tobacco use  Never smoker.  Other Problems Lindwood Coke, RN; 03/01/2021 9:14 AM) Hypercholesterolemia  Ventral Hernia Repair     Review of Systems Ralene Ok MD; 03/01/2021 9:24 AM) General Not Present- Appetite Loss, Chills, Fatigue, Fever, Night Sweats, Weight Gain and Weight Loss. Skin Not Present- Change in Wart/Mole, Dryness, Hives, Jaundice, New Lesions, Non-Healing Wounds, Rash and Ulcer. HEENT Not Present- Earache, Hearing Loss, Hoarseness, Nose Bleed, Oral Ulcers, Ringing in the Ears, Seasonal Allergies, Sinus Pain, Sore Throat, Visual Disturbances, Wears glasses/contact lenses and Yellow Eyes. Respiratory Not Present- Bloody sputum, Chronic Cough, Difficulty  Breathing, Snoring and Wheezing. Breast Not Present- Breast Mass, Breast Pain, Nipple Discharge and Skin Changes. Cardiovascular Not Present- Chest Pain, Difficulty Breathing Lying Down, Leg Cramps, Palpitations, Rapid Heart Rate, Shortness of Breath and Swelling of Extremities. Gastrointestinal Not Present- Abdominal Pain, Bloating, Bloody Stool, Change in Bowel Habits, Chronic diarrhea, Constipation, Difficulty Swallowing, Excessive gas, Gets full quickly at meals, Hemorrhoids, Indigestion, Nausea, Rectal Pain and Vomiting. Male Genitourinary Not Present- Blood in Urine, Change in Urinary Stream, Frequency, Impotence, Nocturia, Painful Urination, Urgency and Urine Leakage. Musculoskeletal Not Present- Back Pain, Joint Pain, Joint Stiffness, Muscle Pain, Muscle Weakness and Swelling of Extremities. Neurological Not Present- Decreased Memory, Fainting, Headaches, Numbness, Seizures, Tingling, Tremor, Trouble walking and Weakness. Psychiatric Not Present- Anxiety, Bipolar, Change in Sleep Pattern, Depression, Fearful and Frequent crying. Endocrine Not Present- Cold Intolerance, Excessive Hunger, Hair Changes, Heat Intolerance, Hot flashes and New Diabetes. Hematology Not Present- Blood Thinners, Easy Bruising, Excessive bleeding, Gland problems, HIV and Persistent Infections. All other systems negative  Vitals (Diane Herrin RN; 03/01/2021 9:15 AM) 03/01/2021 9:15 AM Weight: 199.38 lb Height: 68in Body Surface Area: 2.04 m Body Mass Index: 30.31 kg/m  Temp.: 98.21F  Pulse: 70 (Regular)  P.OX: 96% (Room air) BP: 158/80(Sitting, Left Arm, Standard)       Physical Exam Ralene Ok, MD; 03/01/2021 9:28 AM) Abdomen Inspection Hernias - Left - Inguinal hernia - Reducible - Left.    Assessment & Plan Ralene Ok MD; 03/01/2021 9:28 AM) LEFT INGUINAL HERNIA (K40.90) Impression: 65 year old male with a recurrent left inguinal hernia. Patient like to proceed to the  operative for an open left inguinal hernia repair with mesh. 2. Discussion of the risks and benefits of the procedure to include but not  limited to: Infection, bleeding, damage to structures, possible orchiectomy possible recurrence. Patient was understanding and wished to proceed.

## 2021-03-01 NOTE — H&P (View-Only) (Signed)
History of Present Illness Ralene Ok MD; 03/01/2021 9:27 AM) The patient is a 65 year old male who presents with an inguinal hernia. patient is a 66 year old male, comes in secondary to a recurrent left inguinal hernia. Patient had this repaired in May 2018 by Dr. Kae Heller. He states that he has noticed a bulge to the left inguinal area. 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 Patient states she's not a smoker. This would be a second recurrence. The previous hernia repairs for years ago he does feel that there was mesh placed at that time.     Past Surgical History Lindwood Coke, RN; 03/01/2021 9:14 AM) Hip Surgery  Right.  Diagnostic Studies History Lindwood Coke, RN; 03/01/2021 9:14 AM) Colonoscopy  within last year 5-10 years ago  Allergies Lindwood Coke, RN; 03/01/2021 9:14 AM) No Known Drug Allergies  [03/07/2017]: Allergies Reconciled   Medication History (Diane Herrin, RN; 03/01/2021 9:15 AM) Rosuvastatin Calcium (10MG  Tablet, Oral) Active. Aspirin (81MG  Tablet Chewable, Oral) Active. Medications Reconciled  Social History Lindwood Coke, RN; 03/01/2021 9:14 AM) Caffeine use  Carbonated beverages, Coffee, Tea. No alcohol use  No drug use  Tobacco use  Never smoker.  Other Problems Lindwood Coke, RN; 03/01/2021 9:14 AM) Hypercholesterolemia  Ventral Hernia Repair     Review of Systems Ralene Ok MD; 03/01/2021 9:24 AM) General Not Present- Appetite Loss, Chills, Fatigue, Fever, Night Sweats, Weight Gain and Weight Loss. Skin Not Present- Change in Wart/Mole, Dryness, Hives, Jaundice, New Lesions, Non-Healing Wounds, Rash and Ulcer. HEENT Not Present- Earache, Hearing Loss, Hoarseness, Nose Bleed, Oral Ulcers, Ringing in the Ears, Seasonal Allergies, Sinus Pain, Sore Throat, Visual Disturbances, Wears glasses/contact lenses and Yellow Eyes. Respiratory Not Present- Bloody sputum, Chronic Cough, Difficulty  Breathing, Snoring and Wheezing. Breast Not Present- Breast Mass, Breast Pain, Nipple Discharge and Skin Changes. Cardiovascular Not Present- Chest Pain, Difficulty Breathing Lying Down, Leg Cramps, Palpitations, Rapid Heart Rate, Shortness of Breath and Swelling of Extremities. Gastrointestinal Not Present- Abdominal Pain, Bloating, Bloody Stool, Change in Bowel Habits, Chronic diarrhea, Constipation, Difficulty Swallowing, Excessive gas, Gets full quickly at meals, Hemorrhoids, Indigestion, Nausea, Rectal Pain and Vomiting. Male Genitourinary Not Present- Blood in Urine, Change in Urinary Stream, Frequency, Impotence, Nocturia, Painful Urination, Urgency and Urine Leakage. Musculoskeletal Not Present- Back Pain, Joint Pain, Joint Stiffness, Muscle Pain, Muscle Weakness and Swelling of Extremities. Neurological Not Present- Decreased Memory, Fainting, Headaches, Numbness, Seizures, Tingling, Tremor, Trouble walking and Weakness. Psychiatric Not Present- Anxiety, Bipolar, Change in Sleep Pattern, Depression, Fearful and Frequent crying. Endocrine Not Present- Cold Intolerance, Excessive Hunger, Hair Changes, Heat Intolerance, Hot flashes and New Diabetes. Hematology Not Present- Blood Thinners, Easy Bruising, Excessive bleeding, Gland problems, HIV and Persistent Infections. All other systems negative  Vitals (Diane Herrin RN; 03/01/2021 9:15 AM) 03/01/2021 9:15 AM Weight: 199.38 lb Height: 68in Body Surface Area: 2.04 m Body Mass Index: 30.31 kg/m  Temp.: 98.24F  Pulse: 70 (Regular)  P.OX: 96% (Room air) BP: 158/80(Sitting, Left Arm, Standard)       Physical Exam Ralene Ok, MD; 03/01/2021 9:28 AM) Abdomen Inspection Hernias - Left - Inguinal hernia - Reducible - Left.    Assessment & Plan Ralene Ok MD; 03/01/2021 9:28 AM) LEFT INGUINAL HERNIA (K40.90) Impression: 65 year old male with a recurrent left inguinal hernia. Patient like to proceed to the  operative for an open left inguinal hernia repair with mesh. 2. Discussion of the risks and benefits of the procedure to include but not  limited to: Infection, bleeding, damage to structures, possible orchiectomy possible recurrence. Patient was understanding and wished to proceed.

## 2021-03-09 ENCOUNTER — Encounter (HOSPITAL_COMMUNITY)
Admission: RE | Admit: 2021-03-09 | Discharge: 2021-03-09 | Disposition: A | Discharge status: Home / Self Care | Payer: 59 | Source: Ambulatory Visit | Attending: General Surgery | Admitting: General Surgery

## 2021-03-09 ENCOUNTER — Other Ambulatory Visit: Payer: Self-pay

## 2021-03-09 ENCOUNTER — Encounter (HOSPITAL_COMMUNITY): Payer: Self-pay

## 2021-03-09 DIAGNOSIS — Z01812 Encounter for preprocedural laboratory examination: Secondary | ICD-10-CM | POA: Diagnosis not present

## 2021-03-09 LAB — CBC
HCT: 44.5 % (ref 39.0–52.0)
Hemoglobin: 14.4 g/dL (ref 13.0–17.0)
MCH: 26.7 pg (ref 26.0–34.0)
MCHC: 32.4 g/dL (ref 30.0–36.0)
MCV: 82.6 fL (ref 80.0–100.0)
Platelets: 253 10*3/uL (ref 150–400)
RBC: 5.39 MIL/uL (ref 4.22–5.81)
RDW: 15.2 % (ref 11.5–15.5)
WBC: 6.7 10*3/uL (ref 4.0–10.5)
nRBC: 0 % (ref 0.0–0.2)

## 2021-03-09 LAB — BASIC METABOLIC PANEL
Anion gap: 7 (ref 5–15)
BUN: 20 mg/dL (ref 8–23)
CO2: 26 mmol/L (ref 22–32)
Calcium: 9.2 mg/dL (ref 8.9–10.3)
Chloride: 109 mmol/L (ref 98–111)
Creatinine, Ser: 1 mg/dL (ref 0.61–1.24)
GFR, Estimated: >60 mL/min (ref >60)
Glucose, Bld: 89 mg/dL (ref 70–99)
Potassium: 4.6 mmol/L (ref 3.5–5.1)
Sodium: 142 mmol/L (ref 135–145)

## 2021-03-09 LAB — HEMOGLOBIN A1C
Hgb A1c MFr Bld: 6 % — ABNORMAL HIGH (ref 4.8–5.6)
Mean Plasma Glucose: 125.5 mg/dL

## 2021-03-09 NOTE — Progress Notes (Signed)
COVID Vaccine Completed:no Date COVID Vaccine completed: COVID vaccine manufacturer: Reese   PCP - no Cardiologist - no  Chest x-ray - no EKG - no Stress Test -no  ECHO - no Cardiac Cath - no Pacemaker/ICD device last checked:NA  Sleep Study - yes CPAP - no  Fasting Blood Sugar - NA Checks Blood Sugar _____ times a day  Blood Thinner Instructions:NA Aspirin Instructions: Last Dose:  Anesthesia review:   Patient denies shortness of breath, fever, cough and chest pain at PAT appointment  yes Patient verbalized understanding of instructions that were given to them at the PAT appointment. Patient was also instructed that they will need to review over the PAT instructions again at home before surgery.yes Pt has no SOB with any activities. He doesn't use his C-Pap

## 2021-03-09 NOTE — Patient Instructions (Addendum)
DUE TO COVID-19 ONLY ONE VISITOR IS ALLOWED TO COME WITH YOU AND STAY IN THE WAITING ROOM ONLY DURING PRE OP AND PROCEDURE DAY OF SURGERY. THE 1 VISITOR  MAY VISIT WITH YOU AFTER SURGERY IN YOUR PRIVATE ROOM DURING VISITING HOURS ONLY!  YOU NEED TO HAVE A COVID 19 TEST ON   4/8____ @_8 :35______, THIS TEST MUST BE DONE BEFORE SURGERY,  COVID TESTING SITE [place] [address], IT IS ON THE RIGHT GOING OUT [address] APPROXIMATELY  2 MINUTES PAST [employer] ON THE RIGHT. ONCE YOUR COVID TEST IS COMPLETED,  PLEASE BEGIN THE QUARANTINE INSTRUCTIONS AS OUTLINED IN YOUR HANDOUT.                Rodney Dougherty   Your procedure is scheduled on: 03/14/21   Report to [facility] Main  Entrance   Report to short stay at 5:15 AM     Call this number if you have problems the morning of surgery [place], [place].   No food after midnight.    You may have clear liquid until 4:30 AM.    At 4:30 AM drink pre surgery drink  . Nothing by mouth after 4:30 AM.   Take these medicines the morning of surgery with A SIP OF WATER: none.                                  You may not have any metal on your body including              piercings  Do not wear jewelry,  lotions, powders or deodorant              Men may shave face and neck.   Do not bring valuables to the hospital. [place].  Contacts, dentures or bridgework may not be worn into surgery. .     Patients discharged the day of surgery will not be allowed to drive home.  IF YOU ARE HAVING SURGERY AND GOING HOME THE SAME DAY, YOU MUST HAVE AN ADULT TO DRIVE YOU HOME AND BE WITH YOU FOR 24 HOURS. YOU MAY GO HOME BY TAXI OR UBER OR ORTHERWISE, BUT AN ADULT MUST ACCOMPANY YOU HOME AND STAY WITH YOU FOR 24 HOURS.  Name and phone number of your driver:  Special  Instructions: N/A              Please read over the following fact sheets you were given: _____________________________________________________________________             [facility] - Preparing for Surgery Before surgery, you can play an important role.  Because skin is not sterile, your skin needs to be as free of germs as possible.  You can reduce the number of germs on your skin by washing with CHG (chlorahexidine gluconate) soap before surgery.  CHG is an antiseptic cleaner which kills germs and bonds with the skin to continue killing germs even after washing. Please DO NOT use if you have an allergy to CHG or antibacterial soaps.  If your skin becomes reddened/irritated stop using the CHG and inform your nurse when you arrive at Short Stay.  Marland Kitchen  You may shave your face/neck. Please follow these instructions carefully:  1.  Shower with CHG Soap the night before surgery and the  morning of Surgery.  2.  If you choose to wash your hair, wash your hair first as usual with your  normal  shampoo.  3.  After you shampoo, rinse your hair and body thoroughly to remove the  shampoo.                                        4.  Use CHG as you would any other liquid soap.  You can apply chg directly  to the skin and wash                       Gently with a scrungie or clean washcloth.  5.  Apply the CHG Soap to your body ONLY FROM THE NECK DOWN.   Do not use on face/ open                           Wound or open sores. Avoid contact with eyes, ears mouth and genitals (private parts).                       Wash face,  Genitals (private parts) with your normal soap.             6.  Wash thoroughly, paying special attention to the area where your surgery  will be performed.  7.  Thoroughly rinse your body with warm water from the neck down.  8.  DO NOT shower/wash with your normal soap after using and rinsing off  the CHG Soap.             9.  Pat yourself dry with a clean towel.            10.  Wear  clean pajamas.            11.  Place clean sheets on your bed the night of your first shower and do not  sleep with pets. Day of Surgery : Do not apply any lotions/deodorants the morning of surgery.  Please wear clean clothes to the hospital/surgery center.  FAILURE TO FOLLOW THESE INSTRUCTIONS MAY RESULT IN THE CANCELLATION OF YOUR SURGERY PATIENT SIGNATURE_________________________________  NURSE SIGNATURE__________________________________  ________________________________________________________________________

## 2021-03-10 ENCOUNTER — Other Ambulatory Visit (HOSPITAL_COMMUNITY)
Admission: RE | Admit: 2021-03-10 | Discharge: 2021-03-10 | Disposition: A | Discharge status: Home / Self Care | Payer: 59 | Source: Ambulatory Visit | Attending: General Surgery | Admitting: General Surgery

## 2021-03-10 DIAGNOSIS — Z01812 Encounter for preprocedural laboratory examination: Secondary | ICD-10-CM | POA: Diagnosis present

## 2021-03-10 DIAGNOSIS — Z20822 Contact with and (suspected) exposure to covid-19: Secondary | ICD-10-CM | POA: Insufficient documentation

## 2021-03-11 LAB — SARS CORONAVIRUS 2 (TAT 6-24 HRS): SARS Coronavirus 2: NEGATIVE

## 2021-03-13 NOTE — Anesthesia Preprocedure Evaluation (Addendum)
Anesthesia Evaluation  Patient identified by MRN, date of birth, ID band Patient awake    Reviewed: Allergy & Precautions, H&P , NPO status , Patient's Chart, lab work & pertinent test results  Airway Mallampati: I  TM Distance: >3 FB Neck ROM: Full    Dental no notable dental hx. (+) Edentulous Upper, Edentulous Lower   Pulmonary sleep apnea and Continuous Positive Airway Pressure Ventilation ,    Pulmonary exam normal breath sounds clear to auscultation       Cardiovascular Normal cardiovascular exam Rhythm:Regular Rate:Normal     Neuro/Psych    GI/Hepatic   Endo/Other  negative endocrine ROS  Renal/GU      Musculoskeletal  (+) Arthritis , Osteoarthritis,    Abdominal   Peds  Hematology   Anesthesia Other Findings   Reproductive/Obstetrics                            Anesthesia Physical  Anesthesia Plan  ASA: II  Anesthesia Plan: General   Post-op Pain Management:    Induction: Intravenous  PONV Risk Score and Plan: 2 and Propofol infusion, Treatment may vary due to age or medical condition, Ondansetron and Midazolam  Airway Management Planned: Oral ETT and LMA  Additional Equipment: None  Intra-op Plan:   Post-operative Plan: Extubation in OR  Informed Consent: I have reviewed the patients History and Physical, chart, labs and discussed the procedure including the risks, benefits and alternatives for the proposed anesthesia with the patient or authorized representative who has indicated his/her understanding and acceptance.       Plan Discussed with: CRNA, Anesthesiologist and Surgeon  Anesthesia Plan Comments:         Anesthesia Quick Evaluation

## 2021-03-14 ENCOUNTER — Ambulatory Visit (HOSPITAL_COMMUNITY): Payer: 59 | Admitting: Anesthesiology

## 2021-03-14 ENCOUNTER — Encounter (HOSPITAL_COMMUNITY): Payer: Self-pay | Admitting: General Surgery

## 2021-03-14 ENCOUNTER — Ambulatory Visit (HOSPITAL_COMMUNITY)
Admission: RE | Admit: 2021-03-14 | Discharge: 2021-03-14 | Disposition: A | Discharge status: Home / Self Care | Payer: 59 | Attending: General Surgery | Admitting: General Surgery

## 2021-03-14 ENCOUNTER — Encounter (HOSPITAL_COMMUNITY)
Admission: RE | Disposition: A | Discharge status: Home / Self Care | Payer: Self-pay | Source: Home / Self Care | Attending: General Surgery

## 2021-03-14 DIAGNOSIS — Z79899 Other long term (current) drug therapy: Secondary | ICD-10-CM | POA: Insufficient documentation

## 2021-03-14 DIAGNOSIS — K4091 Unilateral inguinal hernia, without obstruction or gangrene, recurrent: Secondary | ICD-10-CM | POA: Diagnosis present

## 2021-03-14 DIAGNOSIS — Z7982 Long term (current) use of aspirin: Secondary | ICD-10-CM | POA: Insufficient documentation

## 2021-03-14 HISTORY — PX: INGUINAL HERNIA REPAIR: SHX194

## 2021-03-14 SURGERY — REPAIR, HERNIA, INGUINAL, ADULT
Anesthesia: General | Site: Abdomen | Laterality: Left

## 2021-03-14 MED ORDER — MIDAZOLAM HCL 5 MG/5ML IJ SOLN
INTRAMUSCULAR | Status: DC | PRN
  Administered 2021-03-14: 2 mg via INTRAVENOUS

## 2021-03-14 MED ORDER — TRAMADOL HCL 50 MG PO TABS: 50.0000 mg | ORAL_TABLET | Freq: Four times a day (QID) | ORAL | 0 refills | Status: AC | PRN

## 2021-03-14 MED ORDER — EPHEDRINE 5 MG/ML INJ
INTRAVENOUS | Status: AC
  Filled 2021-03-14: qty 10

## 2021-03-14 MED ORDER — OXYCODONE HCL 5 MG PO TABS: 5.0000 mg | ORAL_TABLET | Freq: Once | ORAL | Status: DC | PRN

## 2021-03-14 MED ORDER — MEPERIDINE HCL 50 MG/ML IJ SOLN: 6.2500 mg | INTRAMUSCULAR | Status: DC | PRN

## 2021-03-14 MED ORDER — BUPIVACAINE-EPINEPHRINE 0.25% -1:200000 IJ SOLN
INTRAMUSCULAR | Status: DC | PRN
  Administered 2021-03-14: 10 mL

## 2021-03-14 MED ORDER — LIDOCAINE 2% (20 MG/ML) 5 ML SYRINGE
INTRAMUSCULAR | Status: AC
  Filled 2021-03-14: qty 5

## 2021-03-14 MED ORDER — ONDANSETRON HCL 4 MG/2ML IJ SOLN
INTRAMUSCULAR | Status: DC | PRN
  Administered 2021-03-14: 4 mg via INTRAVENOUS

## 2021-03-14 MED ORDER — ENSURE PRE-SURGERY PO LIQD
296.0000 mL | Freq: Once | ORAL | Status: DC
  Filled 2021-03-14: qty 296

## 2021-03-14 MED ORDER — DIPHENHYDRAMINE HCL 50 MG/ML IJ SOLN
INTRAMUSCULAR | Status: DC | PRN
  Administered 2021-03-14: 12.5 mg via INTRAVENOUS

## 2021-03-14 MED ORDER — DEXAMETHASONE SODIUM PHOSPHATE 10 MG/ML IJ SOLN
INTRAMUSCULAR | Status: AC
  Filled 2021-03-14: qty 1

## 2021-03-14 MED ORDER — OXYCODONE HCL 5 MG/5ML PO SOLN
5.0000 mg | Freq: Once | ORAL | Status: DC | PRN
Start: 2021-03-14 — End: 2021-03-14

## 2021-03-14 MED ORDER — FENTANYL CITRATE (PF) 100 MCG/2ML IJ SOLN
INTRAMUSCULAR | Status: AC
  Filled 2021-03-14: qty 2

## 2021-03-14 MED ORDER — ACETAMINOPHEN 325 MG PO TABS: 325.0000 mg | ORAL_TABLET | ORAL | Status: DC | PRN

## 2021-03-14 MED ORDER — PHENYLEPHRINE HCL (PRESSORS) 10 MG/ML IV SOLN
INTRAVENOUS | Status: AC
  Filled 2021-03-14: qty 1

## 2021-03-14 MED ORDER — BUPIVACAINE-MELOXICAM ER 200-6 MG/7ML IJ SOLN
INTRAMUSCULAR | Status: AC
  Filled 2021-03-14: qty 1

## 2021-03-14 MED ORDER — PROPOFOL 10 MG/ML IV BOLUS
INTRAVENOUS | Status: DC | PRN
  Administered 2021-03-14: 150 mg via INTRAVENOUS

## 2021-03-14 MED ORDER — CHLORHEXIDINE GLUCONATE 0.12 % MT SOLN
15.0000 mL | Freq: Once | OROMUCOSAL | Status: AC
  Administered 2021-03-14: 15 mL via OROMUCOSAL

## 2021-03-14 MED ORDER — CHLORHEXIDINE GLUCONATE CLOTH 2 % EX PADS: 6.0000 | MEDICATED_PAD | Freq: Once | CUTANEOUS | Status: DC

## 2021-03-14 MED ORDER — ACETAMINOPHEN 160 MG/5ML PO SOLN: 325.0000 mg | ORAL | Status: DC | PRN

## 2021-03-14 MED ORDER — ORAL CARE MOUTH RINSE: 15.0000 mL | Freq: Once | OROMUCOSAL | Status: AC

## 2021-03-14 MED ORDER — CEFAZOLIN SODIUM-DEXTROSE 2-4 GM/100ML-% IV SOLN
2.0000 g | INTRAVENOUS | Status: AC
  Administered 2021-03-14: 2 g via INTRAVENOUS
  Filled 2021-03-14: qty 100

## 2021-03-14 MED ORDER — DEXAMETHASONE SODIUM PHOSPHATE 10 MG/ML IJ SOLN
INTRAMUSCULAR | Status: DC | PRN
  Administered 2021-03-14: 5 mg via INTRAVENOUS

## 2021-03-14 MED ORDER — LIDOCAINE HCL (CARDIAC) PF 100 MG/5ML IV SOSY
PREFILLED_SYRINGE | INTRAVENOUS | Status: DC | PRN
  Administered 2021-03-14: 80 mg via INTRAVENOUS

## 2021-03-14 MED ORDER — FENTANYL CITRATE (PF) 100 MCG/2ML IJ SOLN
INTRAMUSCULAR | Status: DC | PRN
  Administered 2021-03-14 (×2): 100 ug via INTRAVENOUS

## 2021-03-14 MED ORDER — PROPOFOL 10 MG/ML IV BOLUS
INTRAVENOUS | Status: AC
  Filled 2021-03-14: qty 20

## 2021-03-14 MED ORDER — ONDANSETRON HCL 4 MG/2ML IJ SOLN: 4.0000 mg | Freq: Once | INTRAMUSCULAR | Status: DC | PRN

## 2021-03-14 MED ORDER — ONDANSETRON HCL 4 MG/2ML IJ SOLN
INTRAMUSCULAR | Status: AC
  Filled 2021-03-14: qty 2

## 2021-03-14 MED ORDER — ROCURONIUM BROMIDE 10 MG/ML (PF) SYRINGE
PREFILLED_SYRINGE | INTRAVENOUS | Status: AC
  Filled 2021-03-14: qty 10

## 2021-03-14 MED ORDER — EPHEDRINE SULFATE 50 MG/ML IJ SOLN
INTRAMUSCULAR | Status: DC | PRN
  Administered 2021-03-14: 10 mg via INTRAVENOUS
  Administered 2021-03-14: 5 mg via INTRAVENOUS
  Administered 2021-03-14: 10 mg via INTRAVENOUS

## 2021-03-14 MED ORDER — PHENYLEPHRINE HCL (PRESSORS) 10 MG/ML IV SOLN
INTRAVENOUS | Status: DC | PRN
  Administered 2021-03-14 (×2): 80 ug via INTRAVENOUS
  Administered 2021-03-14: 40 ug via INTRAVENOUS
  Administered 2021-03-14 (×2): 80 ug via INTRAVENOUS

## 2021-03-14 MED ORDER — BUPIVACAINE-EPINEPHRINE (PF) 0.25% -1:200000 IJ SOLN
INTRAMUSCULAR | Status: AC
  Filled 2021-03-14: qty 30

## 2021-03-14 MED ORDER — ROCURONIUM BROMIDE 100 MG/10ML IV SOLN
INTRAVENOUS | Status: DC | PRN
  Administered 2021-03-14: 50 mg via INTRAVENOUS

## 2021-03-14 MED ORDER — PHENYLEPHRINE 40 MCG/ML (10ML) SYRINGE FOR IV PUSH (FOR BLOOD PRESSURE SUPPORT)
PREFILLED_SYRINGE | INTRAVENOUS | Status: AC
  Filled 2021-03-14: qty 10

## 2021-03-14 MED ORDER — SUGAMMADEX SODIUM 200 MG/2ML IV SOLN
INTRAVENOUS | Status: DC | PRN
  Administered 2021-03-14: 200 mg via INTRAVENOUS

## 2021-03-14 MED ORDER — BUPIVACAINE-MELOXICAM ER 200-6 MG/7ML IJ SOLN
INTRAMUSCULAR | Status: DC | PRN
  Administered 2021-03-14: 7 mL

## 2021-03-14 MED ORDER — SUCCINYLCHOLINE CHLORIDE 200 MG/10ML IV SOSY
PREFILLED_SYRINGE | INTRAVENOUS | Status: AC
  Filled 2021-03-14: qty 10

## 2021-03-14 MED ORDER — ACETAMINOPHEN 500 MG PO TABS
1000.0000 mg | ORAL_TABLET | ORAL | Status: AC
  Administered 2021-03-14: 1000 mg via ORAL
  Filled 2021-03-14: qty 2

## 2021-03-14 MED ORDER — GLYCOPYRROLATE PF 0.2 MG/ML IJ SOSY
PREFILLED_SYRINGE | INTRAMUSCULAR | Status: AC
  Filled 2021-03-14: qty 1

## 2021-03-14 MED ORDER — FENTANYL CITRATE (PF) 100 MCG/2ML IJ SOLN: 25.0000 ug | INTRAMUSCULAR | Status: DC | PRN

## 2021-03-14 MED ORDER — MIDAZOLAM HCL 2 MG/2ML IJ SOLN
INTRAMUSCULAR | Status: AC
  Filled 2021-03-14: qty 2

## 2021-03-14 MED ORDER — LACTATED RINGERS IV SOLN: INTRAVENOUS | Status: DC

## 2021-03-14 MED ORDER — DIPHENHYDRAMINE HCL 50 MG/ML IJ SOLN
INTRAMUSCULAR | Status: AC
  Filled 2021-03-14: qty 1

## 2021-03-14 SURGICAL SUPPLY — 42 items
ADH SKN CLS APL DERMABOND .7 (GAUZE/BANDAGES/DRESSINGS) ×1
APL PRP STRL LF DISP 70% ISPRP (MISCELLANEOUS) ×1
BAG DRN RND TRDRP ANRFLXCHMBR (UROLOGICAL SUPPLIES)
BAG URINE DRAIN 2000ML AR STRL (UROLOGICAL SUPPLIES) IMPLANT
BLADE SURG 15 STRL LF DISP TIS (BLADE) IMPLANT
BLADE SURG 15 STRL SS (BLADE)
BLADE SURG SZ10 CARB STEEL (BLADE) ×2 IMPLANT
CATH FOLEY 3WAY 30CC 16FR (CATHETERS) IMPLANT
CHLORAPREP W/TINT 26 (MISCELLANEOUS) ×2 IMPLANT
COVER WAND RF STERILE (DRAPES) IMPLANT
DECANTER SPIKE VIAL GLASS SM (MISCELLANEOUS) ×2 IMPLANT
DERMABOND ADVANCED (GAUZE/BANDAGES/DRESSINGS) ×1
DERMABOND ADVANCED .7 DNX12 (GAUZE/BANDAGES/DRESSINGS) ×1 IMPLANT
DRAIN PENROSE 0.5X18 (DRAIN) ×2 IMPLANT
DRAPE LAPAROSCOPIC ABDOMINAL (DRAPES) ×2 IMPLANT
ELECT REM PT RETURN 15FT ADLT (MISCELLANEOUS) ×2 IMPLANT
GAUZE 4X4 16PLY RFD (DISPOSABLE) ×2 IMPLANT
GLOVE SURG ENC MOIS LTX SZ7.5 (GLOVE) ×2 IMPLANT
GOWN STRL REUS W/TWL XL LVL3 (GOWN DISPOSABLE) ×4 IMPLANT
KIT BASIN OR (CUSTOM PROCEDURE TRAY) ×2 IMPLANT
KIT TURNOVER KIT A (KITS) ×2 IMPLANT
MESH PARIETEX PROGRIP LEFT (Mesh General) ×2 IMPLANT
NEEDLE HYPO 22GX1.5 SAFETY (NEEDLE) ×2 IMPLANT
PACK BASIC VI WITH GOWN DISP (CUSTOM PROCEDURE TRAY) ×2 IMPLANT
PENCIL SMOKE EVACUATOR (MISCELLANEOUS) IMPLANT
SET IRRIG Y TYPE TUR BLADDER L (SET/KITS/TRAYS/PACK) IMPLANT
SUT MNCRL AB 4-0 PS2 18 (SUTURE) ×2 IMPLANT
SUT PROLENE 2 0 SH DA (SUTURE) ×2 IMPLANT
SUT SILK 0 (SUTURE)
SUT SILK 0 30XBRD TIE 6 (SUTURE) IMPLANT
SUT SILK 2 0 (SUTURE)
SUT SILK 2-0 18XBRD TIE 12 (SUTURE) IMPLANT
SUT VIC AB 0 UR5 27 (SUTURE) ×2 IMPLANT
SUT VIC AB 2-0 SH 27 (SUTURE) ×4
SUT VIC AB 2-0 SH 27X BRD (SUTURE) ×2 IMPLANT
SUT VIC AB 3-0 SH 27 (SUTURE) ×4
SUT VIC AB 3-0 SH 27XBRD (SUTURE) ×2 IMPLANT
SYR BULB IRRIG 60ML STRL (SYRINGE) ×2 IMPLANT
SYR CONTROL 10ML LL (SYRINGE) ×2 IMPLANT
TOWEL OR 17X26 10 PK STRL BLUE (TOWEL DISPOSABLE) ×2 IMPLANT
TOWEL OR NON WOVEN STRL DISP B (DISPOSABLE) ×2 IMPLANT
TRAY FOLEY MTR SLVR 16FR STAT (SET/KITS/TRAYS/PACK) IMPLANT

## 2021-03-14 NOTE — Anesthesia Procedure Notes (Signed)
Procedure Name: Intubation Date/Time: 03/14/2021 7:28 AM Performed by: Elaina Pattee, CRNA Pre-anesthesia Checklist: Patient identified, Emergency Drugs available, Suction available, Patient being monitored and Timeout performed Patient Re-evaluated:Patient Re-evaluated prior to induction Oxygen Delivery Method: Circle system utilized Preoxygenation: Pre-oxygenation with 100% oxygen Induction Type: IV induction Ventilation: Mask ventilation without difficulty and Oral airway inserted - appropriate to patient size Laryngoscope Size: Mac and 4 Grade View: Grade I Tube type: Oral Tube size: 7.5 mm Number of attempts: 1 Airway Equipment and Method: Stylet Placement Confirmation: ETT inserted through vocal cords under direct vision,  positive ETCO2 and breath sounds checked- equal and bilateral Secured at: 22 cm Tube secured with: Tape Dental Injury: Teeth and Oropharynx as per pre-operative assessment

## 2021-03-14 NOTE — Anesthesia Postprocedure Evaluation (Signed)
Anesthesia Post Note  Patient: Rodney Dougherty  Procedure(s) Performed: HERNIA REPAIR LEFT INGUINAL ADULT (Left Abdomen)     Patient location during evaluation: PACU Anesthesia Type: General Level of consciousness: awake and alert Pain management: pain level controlled Vital Signs Assessment: post-procedure vital signs reviewed and stable Respiratory status: spontaneous breathing, nonlabored ventilation, respiratory function stable and patient connected to nasal cannula oxygen Cardiovascular status: blood pressure returned to baseline and stable Postop Assessment: no apparent nausea or vomiting Anesthetic complications: no   No complications documented.  Last Vitals:  Vitals:   03/14/21 0900 03/14/21 0909  BP: (!) 144/74 (!) 153/82  Pulse: 69 60  Resp: 10 12  Temp:  36.4 C  SpO2: 100% 100%    Last Pain:  Vitals:   03/14/21 0909  TempSrc:   PainSc: 0-No pain                 Zymeir Salminen

## 2021-03-14 NOTE — Interval H&P Note (Signed)
History and Physical Interval Note:  03/14/2021 6:53 AM  Rodney Dougherty  has presented today for surgery, with the diagnosis of LEFT INGUINAL HERNIA.  The various methods of treatment have been discussed with the patient and family. After consideration of risks, benefits and other options for treatment, the patient has consented to  Procedure(s) with comments: HERNIA REPAIR LEFT INGUINAL ADULT (Left) - 90MIN/RM6 as a surgical intervention.  The patient's history has been reviewed, patient examined, no change in status, stable for surgery.  I have reviewed the patient's chart and labs.  Questions were answered to the patient's satisfaction.     Ralene Ok

## 2021-03-14 NOTE — Transfer of Care (Signed)
Immediate Anesthesia Transfer of Care Note  Patient: Rodney Dougherty  Procedure(s) Performed: HERNIA REPAIR LEFT INGUINAL ADULT (Left Abdomen)  Patient Location: PACU  Anesthesia Type:General  Level of Consciousness: sedated and drowsy  Airway & Oxygen Therapy: Patient Spontanous Breathing and Patient connected to face mask  Post-op Assessment: Report given to RN and Post -op Vital signs reviewed and stable  Post vital signs: Reviewed and stable  Last Vitals:  Vitals Value Taken Time  BP 141/81 03/14/21 0830  Temp 36.4 C 03/14/21 0828  Pulse 72 03/14/21 0832  Resp 10 03/14/21 0832  SpO2 100 % 03/14/21 0832  Vitals shown include unvalidated device data.  Last Pain:  Vitals:   03/14/21 0828  TempSrc:   PainSc: Asleep         Complications: No complications documented.

## 2021-03-14 NOTE — Discharge Instructions (Signed)
[facility] _______[facility] ° °UMBILICAL OR INGUINAL HERNIA REPAIR: POST OP INSTRUCTIONS ° °Always review your discharge instruction sheet given to you by the facility where your surgery was performed. °IF YOU HAVE DISABILITY OR FAMILY LEAVE FORMS, YOU MUST BRING THEM TO THE OFFICE FOR PROCESSING.   °DO NOT GIVE THEM TO YOUR DOCTOR. ° °1. A  prescription for pain medication may be given to you upon discharge.  Take your pain medication as prescribed, if needed.  If narcotic pain medicine is not needed, then you may take acetaminophen (Tylenol) or ibuprofen (Advil) as needed. °2. Take your usually prescribed medications unless otherwise directed. °If you need a refill on your pain medication, please contact your pharmacy.  They will contact our office to request authorization. Prescriptions will not be filled after 5 pm or on week-ends. °3. You should follow a light diet the first 24 hours after arrival home, such as soup and crackers, etc.  Be sure to include lots of fluids daily.  Resume your normal diet the day after surgery. °4.Most patients will experience some swelling and bruising around the umbilicus or in the groin and scrotum.  Ice packs and reclining will help.  Swelling and bruising can take several days to resolve.  °6. It is common to experience some constipation if taking pain medication after surgery.  Increasing fluid intake and taking a stool softener (such as Colace) will usually help or prevent this problem from occurring.  A mild laxative (Milk of Magnesia or Miralax) should be taken according to package directions if there are no bowel movements after 48 hours. °7. Unless discharge instructions indicate otherwise, you may remove your bandages 24-48 hours after surgery, and you may shower at that time.  You may have steri-strips (small skin tapes) in place directly over the incision.  These strips should be left on the skin for 7-10 days.  If your surgeon used skin glue on the  incision, you may shower in 24 hours.  The glue will flake off over the next 2-3 weeks.  Any sutures or staples will be removed at the office during your follow-up visit. °8. ACTIVITIES:  You may resume regular (light) daily activities beginning the next day--such as daily self-care, walking, climbing stairs--gradually increasing activities as tolerated.  You may have sexual intercourse when it is comfortable.  Refrain from any heavy lifting or straining until approved by your doctor. ° °a.You may drive when you are no longer taking prescription pain medication, you can comfortably wear a seatbelt, and you can safely maneuver your car and apply brakes. °b.RETURN TO WORK:   °_____________________________________________ ° °9.You should see your doctor in the office for a follow-up appointment approximately 2-3 weeks after your surgery.  Make sure that you call for this appointment within a day or two after you arrive home to insure a convenient appointment time. °10.OTHER INSTRUCTIONS: _________________________ °   _____________________________________ ° °WHEN TO CALL YOUR DOCTOR: °1. Fever over 101.0 °2. Inability to urinate °3. Nausea and/or vomiting °4. Extreme swelling or bruising °5. Continued bleeding from incision. °6. Increased pain, redness, or drainage from the incision ° °The clinic staff is available to answer your questions during regular business hours.  Please don’t hesitate to call and ask to speak to one of the nurses for clinical concerns.  If you have a medical emergency, go to the nearest emergency room or call 911.  A surgeon from [facility] is always on call at the hospital ° ° °  [address], [address], [place], [place]  [address] ? ° [address] [address], , [place]   [address] °[phone] ? [phone] ? FAX [phone] °Web site: [email or web address] °

## 2021-03-14 NOTE — Op Note (Signed)
Procedure(s): HERNIA REPAIR LEFT INGUINAL ADULT Procedure Note  Rodney Dougherty male 65 y.o. 03/14/2021  Procedure(s) and Anesthesia Type:    * HERNIA REPAIR LEFT INGUINAL ADULT - General  Surgeon(s) and Role:    Ralene Ok, MD - Primary    * Caryn Bee, MD - Resident-Assisting   Indications:  The patient is a 65 year old male with a history of laparoscopic left inguinal hernia repair and 2018.  He presented to my office with a recurrent left inguinal hernia on exam.  Decision was made to proceed with a open inguinal hernia repair with mesh.     Surgeon: Ralene Ok  Assistants: Caryn Bee   Anesthesia: General endotracheal anesthesia  ASA Class: 2   Procedure Detail  HERNIA REPAIR LEFT INGUINAL ADULT  After informed consent was obtained, the patient was brought to the operating room and positioned on the operating room table in the supine position with both arms out.  The left groin was prepped and draped in a standard sterile fashion.  Preoperative antibiotics were administered. A surgical timeout was performed following the standard institutional protocol.  We started with a 5 cm incision 1 cm parallel and cephalad to the inguinal ligament.  Dissection was carried down with electrocautery to the external oblique fascia.  This was incised with a knife between the fibers and the incision was extended through the external ring and towards the ASIS laterally.  Of note, our first incision through the external oblique fascia was too far cephalad and did not get [place] in the inguinal canal.  This incision was closed with a running 2-0 Vicryl stitch.  The cord structures were then mobilized with blunt dissection right at the pubic tubercle and a Penrose was placed around the cord structures as well as the hernia sac.  Next the hernia sac was separated from the cord structures and a combination of blunt and sharp dissection.  We revealed an indirect hernia sac without any  contents.  This was opened to confirm that it did not contain any bowel.  We then transected the hernia sac right at the base and ligated with a 0 Vicryl suture.    A ProGrip mesh was then brought onto the field and trimmed slightly on both ends. The mesh was then secured medially to the pubic tubercle and inferiorly to the shelving edge of the inguinal ligament with two interrupted 3-0 Prolene stitches.  Superiorly we secured the mesh to the conjoined tendon with another interrupted Prolene stitch.  The 2 tails of the mesh were also sewn together with a single 3-0 Prolene stitch.  Zynrelef was injected into the inguinal canal as well as above the external oblique fascia.  0.25% Marcaine was also injected along the incision.  Hemostasis was confirmed and the external oblique fascia approximated with a running 2-0 Vicryl suture.  Scarpa's fascia was then approximated with a running 3-0 Vicryl and the skin closed with a running subcuticular 4-0 Biosyn stitch.  Dermabond was applied to the incision.  All needle and instrument counts were correct at the end of the case.  The patient was awakened from anesthesia without any complications and transferred to the PACU in stable condition.   Findings: Indirect left inguinal hernia  Estimated Blood Loss:  Minimal         Drains: None         Total IV Fluids: see anesthesia record  Blood Given: none          Specimens: None  Implants: ProGrip mesh        Complications:  * No complications entered in OR log *         Disposition: PACU - hemodynamically stable.         Condition: stable

## 2021-03-16 ENCOUNTER — Encounter (HOSPITAL_COMMUNITY): Payer: Self-pay | Admitting: General Surgery

## 2021-09-27 IMAGING — RF DG HIP (WITH PELVIS) OPERATIVE*R*
1 series · 2 of 2 positions shown · non-contrast
Comparison: Right hip radiographs-02/25/2020

CLINICAL DATA: Right anterior total hip replacement.

EXAM:
OPERATIVE RIGHT HIP (WITH PELVIS IF PERFORMED) 2 VIEWS
TECHNIQUE: Fluoroscopic spot image(s) were submitted for interpretation
post-operatively.
FLUOROSCOPY TIME:  25 seconds

[Series 1: unknown protocol · 0.20mm/px · 2 of 2 slices shown]
[im 1/2]
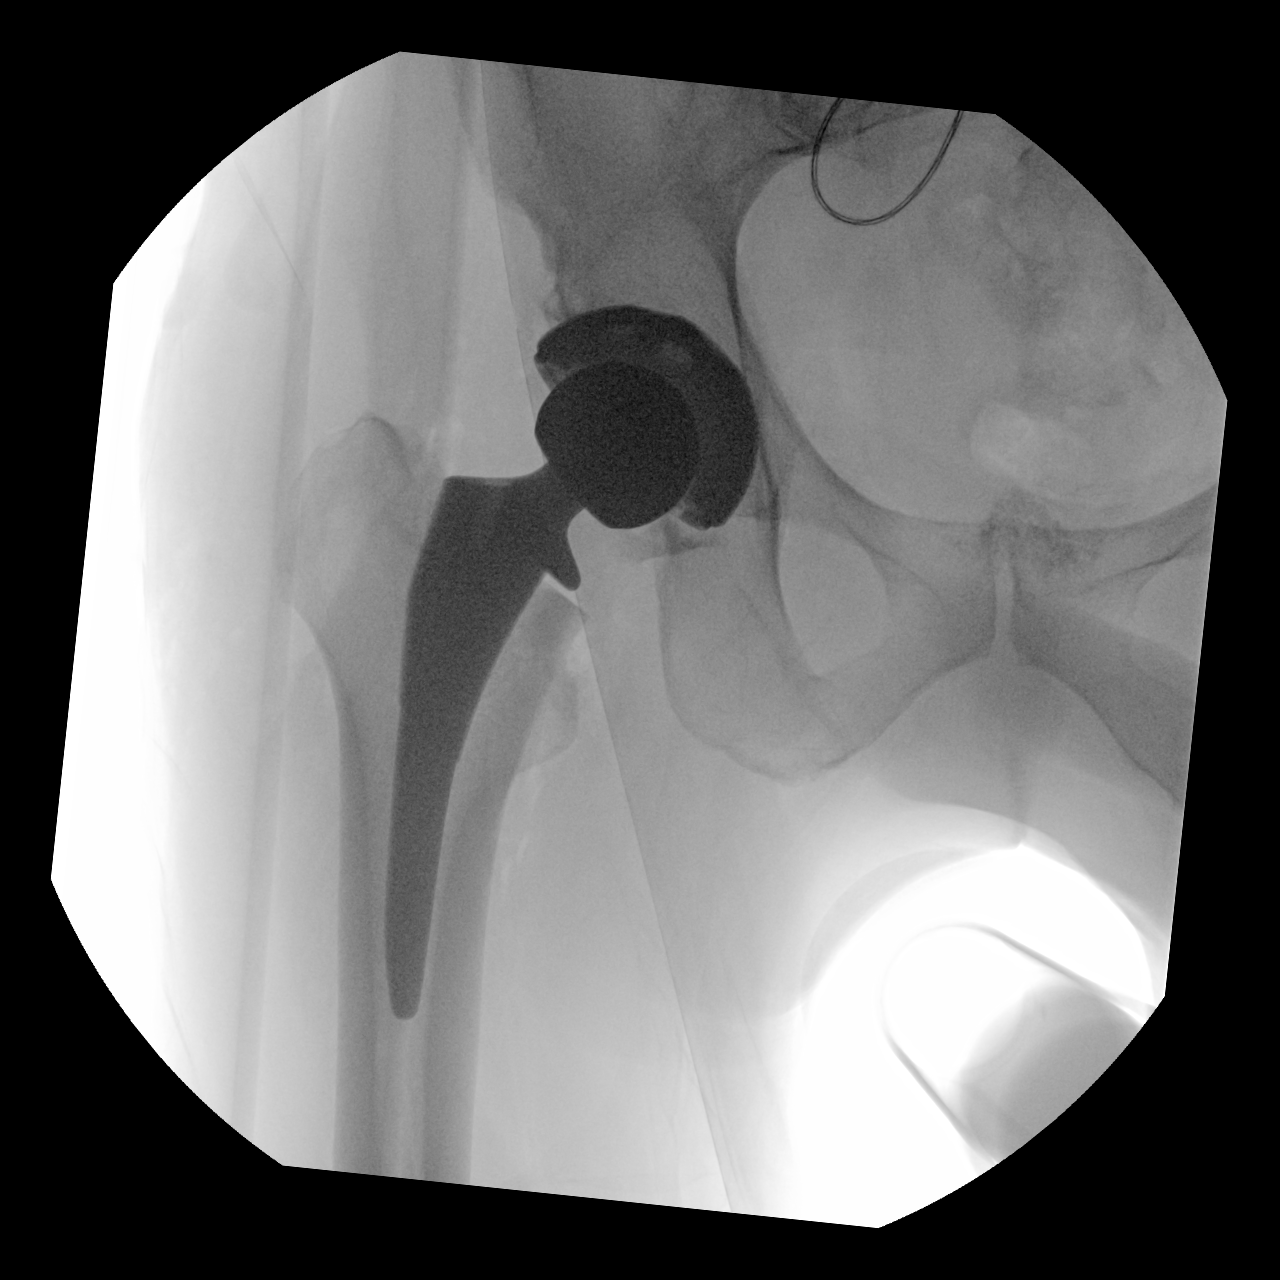
[im 2/2]
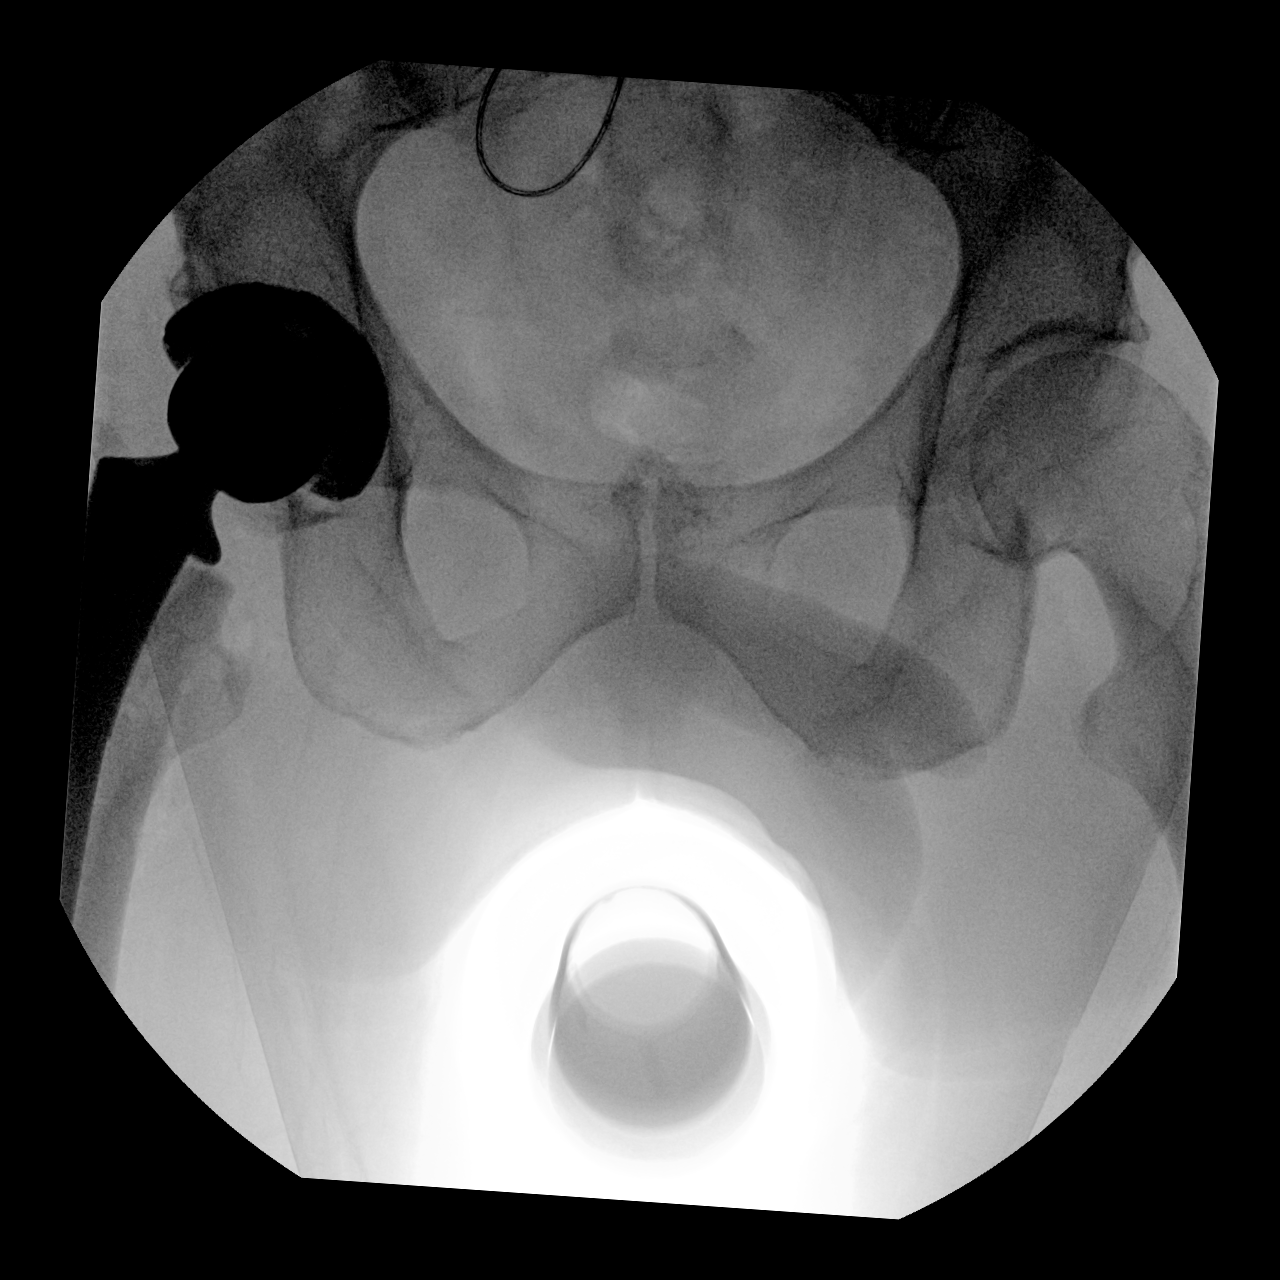

[2 of 2 positions shown; findings below may reference images not displayed]

FINDINGS: 2 spot intraoperative fluoroscopic images of the right hip and lower
pelvis are provided for review

Provided images demonstrate the sequela of right total hip
replacement. Alignment appears anatomic given AP projection. No
definite fracture. There is a minimal amount of expected
subcutaneous emphysema about the operative site.

The mid aspect of the presumed cardiac lead overlies the midline of
the abdomen. No radiopaque foreign body.

Limited visualization of the pelvis suggests mild to moderate
degenerative change of the contralateral left hip, incompletely
evaluated. Dystrophic calcifications overlies expected location of
the prostate gland.
IMPRESSION: Post right total hip replacement without evidence of complication.

## 2021-11-09 ENCOUNTER — Ambulatory Visit: Payer: 59 | Admitting: Orthopaedic Surgery

## 2023-08-15 DIAGNOSIS — Z79899 Other long term (current) drug therapy: Secondary | ICD-10-CM | POA: Diagnosis not present

## 2023-08-15 DIAGNOSIS — Z1331 Encounter for screening for depression: Secondary | ICD-10-CM | POA: Diagnosis not present

## 2023-08-15 DIAGNOSIS — Z0000 Encounter for general adult medical examination without abnormal findings: Secondary | ICD-10-CM | POA: Diagnosis not present

## 2023-08-15 DIAGNOSIS — E7800 Pure hypercholesterolemia, unspecified: Secondary | ICD-10-CM | POA: Diagnosis not present

## 2023-08-15 DIAGNOSIS — Z299 Encounter for prophylactic measures, unspecified: Secondary | ICD-10-CM | POA: Diagnosis not present

## 2023-08-15 DIAGNOSIS — R5383 Other fatigue: Secondary | ICD-10-CM | POA: Diagnosis not present

## 2023-08-15 DIAGNOSIS — Z7189 Other specified counseling: Secondary | ICD-10-CM | POA: Diagnosis not present

## 2023-08-15 DIAGNOSIS — Z125 Encounter for screening for malignant neoplasm of prostate: Secondary | ICD-10-CM | POA: Diagnosis not present

## 2023-08-15 DIAGNOSIS — R252 Cramp and spasm: Secondary | ICD-10-CM | POA: Diagnosis not present

## 2023-08-15 DIAGNOSIS — Z1339 Encounter for screening examination for other mental health and behavioral disorders: Secondary | ICD-10-CM | POA: Diagnosis not present

## 2024-08-18 DIAGNOSIS — E7800 Pure hypercholesterolemia, unspecified: Secondary | ICD-10-CM | POA: Diagnosis not present

## 2024-08-18 DIAGNOSIS — Z1331 Encounter for screening for depression: Secondary | ICD-10-CM | POA: Diagnosis not present

## 2024-08-18 DIAGNOSIS — Z125 Encounter for screening for malignant neoplasm of prostate: Secondary | ICD-10-CM | POA: Diagnosis not present

## 2024-08-18 DIAGNOSIS — Z1339 Encounter for screening examination for other mental health and behavioral disorders: Secondary | ICD-10-CM | POA: Diagnosis not present

## 2024-08-18 DIAGNOSIS — R5383 Other fatigue: Secondary | ICD-10-CM | POA: Diagnosis not present

## 2024-08-18 DIAGNOSIS — Z7189 Other specified counseling: Secondary | ICD-10-CM | POA: Diagnosis not present

## 2024-08-18 DIAGNOSIS — Z299 Encounter for prophylactic measures, unspecified: Secondary | ICD-10-CM | POA: Diagnosis not present

## 2024-08-18 DIAGNOSIS — Z0000 Encounter for general adult medical examination without abnormal findings: Secondary | ICD-10-CM | POA: Diagnosis not present

## 2024-08-18 DIAGNOSIS — Z79899 Other long term (current) drug therapy: Secondary | ICD-10-CM | POA: Diagnosis not present

## 2024-09-22 ENCOUNTER — Other Ambulatory Visit: Payer: Self-pay

## 2024-09-22 ENCOUNTER — Ambulatory Visit: Payer: Self-pay | Admitting: Orthopaedic Surgery

## 2024-09-22 ENCOUNTER — Telehealth: Payer: Self-pay

## 2024-09-22 DIAGNOSIS — M1612 Unilateral primary osteoarthritis, left hip: Secondary | ICD-10-CM | POA: Diagnosis not present

## 2024-09-22 NOTE — Progress Notes (Signed)
 Office Visit Note  = Patient: Rodney Dougherty           Date of Birth: [birth year]           MRN: [id number] Visit Date: 09/22/2024              Requested by: Maree Isles, MD [address] [place],  [place] [address] PCP: Maree Isles, MD   Assessment & Plan: Visit Diagnoses:  1. Primary osteoarthritis of left hip     Plan: History of Present Illness Rodney Dougherty is a 68 year old male with a history of right hip replacement who presents with left hip pain.  He experiences chronic throbbing pain in the left hip and groin area, similar to the pain prior to his right hip replacement. The pain does not wake him at night but prevents him from finding a comfortable sleeping position. It intensifies with prolonged walking, such as during a recent three-hour walk, after which he needed to rest for thirty minutes to alleviate discomfort. Despite the pain, he continues to work daily, performing physical activities like climbing ladders and moving through houses.  He has tried over-the-counter medications like Advil  and Aleve  without significant relief and does not take them regularly. He lives alone and has someone nearby who can assist him post-operatively if needed.   Assessment and Plan Left hip osteoarthritis  Impression is severe left hip degenerative joint disease secondary to Osteoarthritis.  Patient has attempted conservative treatment for at least 6 consecutive weeks within the past 12 weeks, including but not limited to physical therapy, home exercise program, NSAIDs, activity modification, and/or corticosteroid injections. Despite these efforts, symptoms have not improved or have worsened. Conservative measures have been deemed unsuccessful at this time. After a detailed discussion covering diagnosis and treatment options--including the risks, benefits, alternatives, and potential complications of surgical and nonsurgical management--the patient elected to proceed with surgery.  Current  anticoagulants: No antithrombotic Postop anticoagulation: Aspirin  81 mg Diabetic: No  Prior DVT/PE: No Tobacco use: No Clearances needed for surgery: None Anticipate discharge dispo: outpatient   Follow-Up Instructions: No follow-ups on file.   Orders:  Orders Placed This Encounter  Procedures   XR HIP UNILAT W OR W/O PELVIS 2-3 VIEWS LEFT   No orders of the defined types were placed in this encounter.     Procedures: No procedures performed   Clinical Data: No additional findings.   Subjective: Chief Complaint  Patient presents with   Left Hip - Pain    HPI  Review of Systems  Constitutional: Negative.   HENT: Negative.    Eyes: Negative.   Respiratory: Negative.    Cardiovascular: Negative.   Gastrointestinal: Negative.   Endocrine: Negative.   Genitourinary: Negative.   Skin: Negative.   Allergic/Immunologic: Negative.   Neurological: Negative.   Hematological: Negative.   Psychiatric/Behavioral: Negative.    All other systems reviewed and are negative.    Objective: Vital Signs: There were no vitals taken for this visit.  Physical Exam Vitals and nursing note reviewed.  Constitutional:      Appearance: He is well-developed.  HENT:     Head: Normocephalic and atraumatic.  Eyes:     Pupils: Pupils are equal, round, and reactive to light.  Pulmonary:     Effort: Pulmonary effort is normal.  Abdominal:     Palpations: Abdomen is soft.  Musculoskeletal:        General: Normal range of motion.     Cervical back: Neck  supple.  Skin:    General: Skin is warm.  Neurological:     Mental Status: He is alert and oriented to person, place, and time.  Psychiatric:        Behavior: Behavior normal.        Thought Content: Thought content normal.        Judgment: Judgment normal.     Ortho Exam  Specialty Comments:  No specialty comments available.  Imaging: XR HIP UNILAT W OR W/O PELVIS 2-3 VIEWS LEFT Result Date: 09/22/2024 Xrays of the  left hip show advanced osteoarthritis with bone-on-bone joint space narrowing.  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 reports it was about 20 yrs. ago, no problems since then     HAND TENDON SURGERY Left 1999   occupational injury involving artery as well   HERNIA REPAIR Left 1975   inguinal    INGUINAL HERNIA REPAIR Left 04/16/2017   Procedure: LAPAROSCOPIC LEFT INGUINAL HERNIA REPAIR;  Surgeon: Signe Mitzie LABOR, MD;  Location: [facility] OR;  Service: General;  Laterality: Left;   INGUINAL HERNIA REPAIR Left 03/14/2021   Procedure: HERNIA REPAIR LEFT INGUINAL ADULT;  Surgeon: Rubin Calamity, MD;  Location: [facility] ORS;  Service: General;  Laterality: Left;  90MIN/RM6   INSERTION OF MESH Left 04/16/2017   Procedure: INSERTION OF MESH;  Surgeon: Signe Mitzie LABOR, MD;  Location: [facility] OR;  Service: General;  Laterality: Left;   TOTAL HIP ARTHROPLASTY Right 11/14/2020   Procedure: RIGHT TOTAL HIP ARTHROPLASTY ANTERIOR APPROACH;  Surgeon: Jerri Kay HERO, MD;  Location: [facility] ORS;  Service: Orthopedics;  Laterality: Right;  request 3C bed   Social History   Occupational History   Occupation: Full time  Tobacco Use   Smoking status: Never   Smokeless tobacco: Never  Vaping Use   Vaping status: Never Used  Substance and  Sexual Activity   Alcohol  use: Yes    Comment: rarely   Drug use: No   Sexual activity: Not on file

## 2024-09-22 NOTE — Telephone Encounter (Signed)
 Patient given surgical clearance form for PCP. Aware that we must receive clearance form back before being able to proceed with scheduling surgery.

## 2024-10-01 DIAGNOSIS — J329 Chronic sinusitis, unspecified: Secondary | ICD-10-CM | POA: Diagnosis not present

## 2024-10-01 DIAGNOSIS — Z299 Encounter for prophylactic measures, unspecified: Secondary | ICD-10-CM | POA: Diagnosis not present

## 2024-10-01 DIAGNOSIS — M25552 Pain in left hip: Secondary | ICD-10-CM | POA: Diagnosis not present

## 2024-10-05 ENCOUNTER — Encounter: Payer: Self-pay | Admitting: Radiology

## 2024-11-09 NOTE — Progress Notes (Signed)
 PCP - Shah,Amish,MD Cardiologist -   PPM/ICD -  Device Orders -  Rep Notified -   Chest x-ray -  EKG -  Stress Test -  ECHO -  Cardiac Cath -   Sleep Study -  CPAP -   Fasting Blood Sugar -  Checks Blood Sugar _____ times a day  Blood Thinner Instructions: Aspirin  Instructions:  ERAS Protcol - PRE-SURGERY G2-    COVID vaccine -  Activity-- Anesthesia review:   Patient denies shortness of breath, fever, cough and chest pain at PAT appointment   All instructions explained to the patient, with a verbal understanding of the material. Patient agrees to go over the instructions while at home for a better understanding. Patient also instructed to self quarantine after being tested for COVID-19. The opportunity to ask questions was provided.

## 2024-11-09 NOTE — Patient Instructions (Addendum)
 SURGICAL WAITING ROOM VISITATION  Patients having surgery or a procedure may have no more than 2 support people in the waiting area - these visitors may rotate.    Children under the age of 81 must have an adult with them who is not the patient.  Visitors with respiratory illnesses are discouraged from visiting and should remain at home.  If the patient needs to stay at the hospital during part of their recovery, the visitor guidelines for inpatient rooms apply. Pre-op nurse will coordinate an appropriate time for 1 support person to accompany patient in pre-op.  This support person may not rotate.    Please refer to the [facility] website for the visitor guidelines for Inpatients (after your surgery is over and you are in a regular room).       Your procedure is scheduled on: 11-20-24   Report to [facility] Main Entrance    Report to admitting at     0515  AM   Call this number if you have problems the morning of surgery [phone]   Do not eat food :After Midnight.   After Midnight you may have the following liquids until _0430_____ AM/DAY OF SURGERY   then nothing by mouth  Water  Non-Citrus Juices (without pulp, NO RED-Apple, White grape, White cranberry) Black Coffee (NO MILK/CREAM OR CREAMERS, sugar ok)  Clear Tea (NO MILK/CREAM OR CREAMERS, sugar ok) regular and decaf                             Plain Jell-O (NO RED)                                           Fruit ices (not with fruit pulp, NO RED)                                     Popsicles (NO RED)                                                               Sports drinks like Gatorade (NO RED)                      The day of surgery:  Drink ONE (1) Pre-Surgery  G2 BY     0430 AM the morning of surgery. Drink in one sitting. Do not sip.  This drink was given to you during your hospital  pre-op appointment visit. Nothing else to drink after completing the  Pre-Surgery  G2.          If you have  questions, please contact your surgeon's office.   FOLLOW  ANY ADDITIONAL PRE OP INSTRUCTIONS YOU RECEIVED FROM YOUR SURGEON'S OFFICE!!!     Oral Hygiene is also important to reduce your risk of infection.                                    Remember - BRUSH YOUR TEETH THE MORNING OF  SURGERY WITH YOUR REGULAR TOOTHPASTE  DENTURES WILL BE REMOVED PRIOR TO SURGERY PLEASE DO NOT APPLY Poly grip OR ADHESIVES!!!   Do NOT smoke after Midnight   Stop all vitamins and herbal supplements 7 days before surgery.   Take these medicines the morning of surgery with A SIP OF WATER : rosuvastatin   DO NOT TAKE ANY ORAL DIABETIC MEDICATIONS DAY OF YOUR SURGERY  Bring CPAP mask and tubing day of surgery.                              You may not have any metal on your body including hair pins, jewelry, and body piercing             Do not wear lotions, powders, cologne, or deodorant               Men may shave face and neck.   Do not bring valuables to the hospital. [facility] IS NOT             RESPONSIBLE   FOR VALUABLES.   Contacts, glasses, dentures or bridgework may not be worn into surgery.   Bring small overnight bag day of surgery.   DO NOT BRING YOUR HOME MEDICATIONS TO THE HOSPITAL. PHARMACY WILL DISPENSE MEDICATIONS LISTED ON YOUR MEDICATION LIST TO YOU DURING YOUR ADMISSION IN THE HOSPITAL!    Patients discharged on the day of surgery will not be allowed to drive home.  Someone NEEDS to stay with you for the first 24 hours after anesthesia.   Special Instructions: Bring a copy of your healthcare power of attorney and living will documents the day of surgery if you haven't scanned them before.              Please read over the following fact sheets you were given: IF YOU HAVE QUESTIONS ABOUT YOUR PRE-OP INSTRUCTIONS PLEASE CALL [phone].   If you test positive for Covid or have been in contact with anyone that has tested positive in the last 10 days please notify you surgeon.       Pre-operative 4 CHG Bath Instructions  DYNA-Hex 4 Chlorhexidine  Gluconate 4% Solution Antiseptic 4 fl. oz   You can play a key role in reducing the risk of infection after surgery. Your skin needs to be as free of germs as possible. You can reduce the number of germs on your skin by washing with CHG (chlorhexidine  gluconate) soap before surgery. CHG is an antiseptic soap that kills germs and continues to kill germs even after washing.   DO NOT use if you have an allergy to chlorhexidine /CHG or antibacterial soaps. If your skin becomes reddened or irritated, stop using the CHG and notify one of our RNs at   Please shower with the CHG soap starting 4 days before surgery using the following schedule:     Please keep in mind the following:  DO NOT shave, including legs and underarms, starting the day of your first shower.   You may shave your face at any point before/day of surgery.  Place clean sheets on your bed the day you start using CHG soap. Use a clean washcloth (not used since being washed) for each shower. DO NOT sleep with pets once you start using the CHG.  CHG Shower Instructions:  If you choose to wash your hair and private area, wash first with your normal shampoo/soap.  After you use shampoo/soap, rinse your hair and  body thoroughly to remove shampoo/soap residue.  Turn the water  OFF and apply about 3 tablespoons (45 ml) of CHG soap to a CLEAN washcloth.  Apply CHG soap ONLY FROM YOUR NECK DOWN TO YOUR TOES (washing for 3-5 minutes)  DO NOT use CHG soap on face, private areas, open wounds, or sores.  Pay special attention to the area where your surgery is being performed.  If you are having back surgery, having someone wash your back for you may be helpful. Wait 2 minutes after CHG soap is applied, then you may rinse off the CHG soap.  Pat dry with a clean towel  Put on clean clothes/pajamas   If you choose to wear lotion, please use ONLY the CHG-compatible lotions on the  back of this paper.     Additional instructions for the day of surgery: DO NOT APPLY any lotions, deodorants, cologne, or perfumes.   Put on clean/comfortable clothes.  Brush your teeth.  Ask your nurse before applying any prescription medications to the skin.   CHG Compatible Lotions   Aveeno Moisturizing lotion  Cetaphil Moisturizing Cream  Cetaphil Moisturizing Lotion  Clairol Herbal Essence Moisturizing Lotion, Dry Skin  Clairol Herbal Essence Moisturizing Lotion, Extra Dry Skin  Clairol Herbal Essence Moisturizing Lotion, Normal Skin  Curel Age Defying Therapeutic Moisturizing Lotion with Alpha Hydroxy  Curel Extreme Care Body Lotion  Curel Soothing Hands Moisturizing Hand Lotion  Curel Therapeutic Moisturizing Cream, Fragrance-Free  Curel Therapeutic Moisturizing Lotion, Fragrance-Free  Curel Therapeutic Moisturizing Lotion, Original Formula  Eucerin Daily Replenishing Lotion  Eucerin Dry Skin Therapy Plus Alpha Hydroxy Crme  Eucerin Dry Skin Therapy Plus Alpha Hydroxy Lotion  Eucerin Original Crme  Eucerin Original Lotion  Eucerin Plus Crme Eucerin Plus Lotion  Eucerin TriLipid Replenishing Lotion  Keri Anti-Bacterial Hand Lotion  Keri Deep Conditioning Original Lotion Dry Skin Formula Softly Scented  Keri Deep Conditioning Original Lotion, Fragrance Free Sensitive Skin Formula  Keri Lotion Fast Absorbing Fragrance Free Sensitive Skin Formula  Keri Lotion Fast Absorbing Softly Scented Dry Skin Formula  Keri Original Lotion  Keri Skin Renewal Lotion Keri Silky Smooth Lotion  Keri Silky Smooth Sensitive Skin Lotion  Nivea Body Creamy Conditioning Oil  Nivea Body Extra Enriched Lotion  Nivea Body Original Lotion  Nivea Body Sheer Moisturizing Lotion Nivea Crme  Nivea Skin Firming Lotion  NutraDerm 30 Skin Lotion  NutraDerm Skin Lotion  NutraDerm Therapeutic Skin Cream  NutraDerm Therapeutic Skin Lotion  ProShield Protective Hand Cream  Provon moisturizing  lotion  Incentive Spirometer  An incentive spirometer is a tool that can help keep your lungs clear and active. This tool measures how well you are filling your lungs with each breath. Taking long deep breaths may help reverse or decrease the chance of developing breathing (pulmonary) problems (especially infection) following: A long period of time when you are unable to move or be active. BEFORE THE PROCEDURE  If the spirometer includes an indicator to show your best effort, your nurse or respiratory therapist will set it to a desired goal. If possible, sit up straight or lean slightly forward. Try not to slouch. Hold the incentive spirometer in an upright position. INSTRUCTIONS FOR USE  Sit on the edge of your bed if possible, or sit up as far as you can in bed or on a chair. Hold the incentive spirometer in an upright position. Breathe out normally. Place the mouthpiece in your mouth and seal your lips tightly around it. Breathe in slowly and as deeply  as possible, raising the piston or the ball toward the top of the column. Hold your breath for 3-5 seconds or for as long as possible. Allow the piston or ball to fall to the bottom of the column. Remove the mouthpiece from your mouth and breathe out normally. Rest for a few seconds and repeat Steps 1 through 7 at least 10 times every 1-2 hours when you are awake. Take your time and take a few normal breaths between deep breaths. The spirometer may include an indicator to show your best effort. Use the indicator as a goal to work toward during each repetition. After each set of 10 deep breaths, practice coughing to be sure your lungs are clear. If you have an incision (the cut made at the time of surgery), support your incision when coughing by placing a pillow or rolled up towels firmly against it. Once you are able to get out of bed, walk around indoors and cough well. You may stop using the incentive spirometer when instructed by your  caregiver.  RISKS AND COMPLICATIONS Take your time so you do not get dizzy or light-headed. If you are in pain, you may need to take or ask for pain medication before doing incentive spirometry. It is harder to take a deep breath if you are having pain. AFTER USE Rest and breathe slowly and easily. It can be helpful to keep track of a log of your progress. Your caregiver can provide you with a simple table to help with this. If you are using the spirometer at home, follow these instructions: SEEK MEDICAL CARE IF:  You are having difficultly using the spirometer. You have trouble using the spirometer as often as instructed. Your pain medication is not giving enough relief while using the spirometer. You develop fever of 100.5 F (38.1 C) or higher. SEEK IMMEDIATE MEDICAL CARE IF:  You cough up bloody sputum that had not been present before. You develop fever of 102 F (38.9 C) or greater. You develop worsening pain at or near the incision site. MAKE SURE YOU:  Understand these instructions. Will watch your condition. Will get help right away if you are not doing well or get worse. Document Released: 04/01/2007 Document Revised: 02/11/2012 Document Reviewed: 06/02/2007 ExitCare Patient Information 2014 Exit[employer], [place].   ________________________________________________________________________ WHAT IS A BLOOD TRANSFUSION? Blood Transfusion Information  A transfusion is the replacement of blood or some of its parts. Blood is made up of multiple cells which provide different functions. Red blood cells carry oxygen and are used for blood loss replacement. White blood cells fight against infection. Platelets control bleeding. Plasma helps clot blood. Other blood products are available for specialized needs, such as hemophilia or other clotting disorders. BEFORE THE TRANSFUSION  Who gives blood for transfusions?  Healthy volunteers who are fully evaluated to make sure their blood is  safe. This is blood bank blood. Transfusion therapy is the safest it has ever been in the practice of medicine. Before blood is taken from a donor, a complete history is taken to make sure that person has no history of diseases nor engages in risky social behavior (examples are intravenous drug use or sexual activity with multiple partners). The donor's travel history is screened to minimize risk of transmitting infections, such as malaria. The donated blood is tested for signs of infectious diseases, such as HIV and hepatitis. The blood is then tested to be sure it is compatible with you in order to minimize the chance of a  transfusion reaction. If you or a relative donates blood, this is often done in anticipation of surgery and is not appropriate for emergency situations. It takes many days to process the donated blood. RISKS AND COMPLICATIONS Although transfusion therapy is very safe and saves many lives, the main dangers of transfusion include:  Getting an infectious disease. Developing a transfusion reaction. This is an allergic reaction to something in the blood you were given. Every precaution is taken to prevent this. The decision to have a blood transfusion has been considered carefully by your caregiver before blood is given. Blood is not given unless the benefits outweigh the risks. AFTER THE TRANSFUSION Right after receiving a blood transfusion, you will usually feel much better and more energetic. This is especially true if your red blood cells have gotten low (anemic). The transfusion raises the level of the red blood cells which carry oxygen, and this usually causes an energy increase. The nurse administering the transfusion will monitor you carefully for complications. HOME CARE INSTRUCTIONS  No special instructions are needed after a transfusion. You may find your energy is better. Speak with your caregiver about any limitations on activity for underlying diseases you may have. SEEK  MEDICAL CARE IF:  Your condition is not improving after your transfusion. You develop redness or irritation at the intravenous (IV) site. SEEK IMMEDIATE MEDICAL CARE IF:  Any of the following symptoms occur over the next 12 hours: Shaking chills. You have a temperature by mouth above 102 F (38.9 C), not controlled by medicine. Chest, back, or muscle pain. People around you feel you are not acting correctly or are confused. Shortness of breath or difficulty breathing. Dizziness and fainting. You get a rash or develop hives. You have a decrease in urine output. Your urine turns a dark color or changes to pink, red, or brown. Any of the following symptoms occur over the next 10 days: You have a temperature by mouth above 102 F (38.9 C), not controlled by medicine. Shortness of breath. Weakness after normal activity. The white part of the eye turns yellow (jaundice). You have a decrease in the amount of urine or are urinating less often. Your urine turns a dark color or changes to pink, red, or brown. Document Released: 11/16/2000 Document Revised: 02/11/2012 Document Reviewed: 07/05/2008 [facility] Patient Information 2014 [place], [place].  _______________________________________________________________________

## 2024-11-11 ENCOUNTER — Other Ambulatory Visit: Payer: Self-pay

## 2024-11-11 ENCOUNTER — Encounter (HOSPITAL_COMMUNITY): Payer: Self-pay

## 2024-11-11 ENCOUNTER — Other Ambulatory Visit: Payer: Self-pay | Admitting: Physician Assistant

## 2024-11-11 ENCOUNTER — Encounter (HOSPITAL_COMMUNITY)
Admission: RE | Admit: 2024-11-11 | Discharge: 2024-11-11 | Disposition: A | Source: Ambulatory Visit | Attending: Orthopaedic Surgery | Admitting: Orthopaedic Surgery

## 2024-11-11 VITALS — BP 153/92 | HR 67 | Temp 97.9°F | Resp 16 | Ht 69.0 in | Wt 198.0 lb

## 2024-11-11 DIAGNOSIS — Z01818 Encounter for other preprocedural examination: Secondary | ICD-10-CM

## 2024-11-11 DIAGNOSIS — M1612 Unilateral primary osteoarthritis, left hip: Secondary | ICD-10-CM

## 2024-11-11 LAB — CBC
HCT: 47.2 % (ref 39.0–52.0)
Hemoglobin: 15.5 g/dL (ref 13.0–17.0)
MCH: 27.9 pg (ref 26.0–34.0)
MCHC: 32.8 g/dL (ref 30.0–36.0)
MCV: 84.9 fL (ref 80.0–100.0)
Platelets: 271 K/uL (ref 150–400)
RBC: 5.56 MIL/uL (ref 4.22–5.81)
RDW: 13.7 % (ref 11.5–15.5)
WBC: 7.5 K/uL (ref 4.0–10.5)
nRBC: 0 % (ref 0.0–0.2)

## 2024-11-11 LAB — SURGICAL PCR SCREEN
MRSA, PCR: NEGATIVE
Staphylococcus aureus: NEGATIVE

## 2024-11-11 LAB — BASIC METABOLIC PANEL WITH GFR
Anion gap: 10 (ref 5–15)
BUN: 16 mg/dL (ref 8–23)
CO2: 26 mmol/L (ref 22–32)
Calcium: 9 mg/dL (ref 8.9–10.3)
Chloride: 106 mmol/L (ref 98–111)
Creatinine, Ser: 0.91 mg/dL (ref 0.61–1.24)
GFR, Estimated: >60 mL/min (ref >60)
Glucose, Bld: 115 mg/dL — ABNORMAL HIGH (ref 70–99)
Potassium: 4.6 mmol/L (ref 3.5–5.1)
Sodium: 141 mmol/L (ref 135–145)

## 2024-11-11 MED ORDER — OXYCODONE-ACETAMINOPHEN 5-325 MG PO TABS: 1.0000 | ORAL_TABLET | Freq: Four times a day (QID) | ORAL | 0 refills | Status: AC | PRN

## 2024-11-11 MED ORDER — ONDANSETRON HCL 4 MG PO TABS: 4.0000 mg | ORAL_TABLET | Freq: Three times a day (TID) | ORAL | 0 refills | Status: AC | PRN

## 2024-11-11 MED ORDER — DOCUSATE SODIUM 100 MG PO CAPS: 100.0000 mg | ORAL_CAPSULE | Freq: Every day | ORAL | 2 refills | Status: AC | PRN

## 2024-11-11 MED ORDER — ASPIRIN 81 MG PO CHEW: 81.0000 mg | CHEWABLE_TABLET | Freq: Two times a day (BID) | ORAL | 0 refills | Status: AC

## 2024-11-11 MED ORDER — METHOCARBAMOL 750 MG PO TABS: 750.0000 mg | ORAL_TABLET | Freq: Three times a day (TID) | ORAL | 2 refills | Status: AC | PRN

## 2024-11-13 NOTE — Care Plan (Signed)
 Ortho Bundle Case Management Note  Patient Details  Name: Rodney Dougherty MRN: [id number] Date of Birth: 14-Apr-1956  [facility] RNCM call to patient to discuss his upcoming Left total hip arthroplasty with Dr. Jerri at [facility] on 11/20/24. He is agreeable to case management. He plans to return home with assistance from his son after discharge. He already has a RW and states he doesn't think he needs HHPT due to having other hip replacement done in last several years. Reviewed post op care instructions and copy emailed to patient. Will continue to follow for needs.                  DME Arranged:   (Patient has RW already) DME Agency:     HH Arranged:    HH Agency:   (No HHPT wanted per patient)  Additional Comments: Please contact me with any questions of if this plan should need to change.  Tylene Ned, RN, BSN, [employer]  [phone] 11/13/2024, 4:09 PM

## 2024-11-19 ENCOUNTER — Encounter (HOSPITAL_COMMUNITY): Payer: Self-pay | Admitting: Orthopaedic Surgery

## 2024-11-19 MED ORDER — TRANEXAMIC ACID 1000 MG/10ML IV SOLN
2000.0000 mg | INTRAVENOUS | Status: DC
  Filled 2024-11-19: qty 20

## 2024-11-19 NOTE — Anesthesia Preprocedure Evaluation (Signed)
 Anesthesia Evaluation    Reviewed: Allergy & Precautions, Patient's Chart, lab work & pertinent test results  Airway        Dental   Pulmonary sleep apnea           Cardiovascular negative cardio ROS      Neuro/Psych negative neurological ROS  negative psych ROS   GI/Hepatic negative GI ROS, Neg liver ROS,,,  Endo/Other  negative endocrine ROS    Renal/GU negative Renal ROS  negative genitourinary   Musculoskeletal  (+) Arthritis ,    Abdominal   Peds  Hematology negative hematology ROS (+) Prediabetes    Anesthesia Other Findings   Reproductive/Obstetrics                              Anesthesia Physical Anesthesia Plan  ASA: 2  Anesthesia Plan: Spinal   Post-op Pain Management: Tylenol  PO (pre-op)*   Induction:   PONV Risk Score and Plan: 1 and Treatment may vary due to age or medical condition, Midazolam , Propofol  infusion, Ondansetron  and Dexamethasone   Airway Management Planned: Natural Airway  Additional Equipment:   Intra-op Plan:   Post-operative Plan:   Informed Consent: I have reviewed the patients History and Physical, chart, labs and discussed the procedure including the risks, benefits and alternatives for the proposed anesthesia with the patient or authorized representative who has indicated his/her understanding and acceptance.     Dental advisory given  Plan Discussed with: CRNA  Anesthesia Plan Comments:         Anesthesia Quick Evaluation

## 2024-11-20 ENCOUNTER — Encounter (HOSPITAL_COMMUNITY): Admission: RE | Payer: Self-pay | Source: Home / Self Care

## 2024-11-20 ENCOUNTER — Ambulatory Visit (HOSPITAL_BASED_OUTPATIENT_CLINIC_OR_DEPARTMENT_OTHER): Payer: Self-pay

## 2024-11-20 ENCOUNTER — Other Ambulatory Visit: Payer: Self-pay

## 2024-11-20 ENCOUNTER — Ambulatory Visit (HOSPITAL_COMMUNITY)

## 2024-11-20 ENCOUNTER — Encounter (HOSPITAL_COMMUNITY): Payer: Self-pay | Admitting: Medical

## 2024-11-20 ENCOUNTER — Ambulatory Visit (HOSPITAL_COMMUNITY)
Admission: RE | Admit: 2024-11-20 | Discharge: 2024-11-20 | Disposition: A | Attending: Orthopaedic Surgery | Admitting: Orthopaedic Surgery

## 2024-11-20 ENCOUNTER — Encounter (HOSPITAL_COMMUNITY): Payer: Self-pay | Admitting: Orthopaedic Surgery

## 2024-11-20 DIAGNOSIS — M25752 Osteophyte, left hip: Secondary | ICD-10-CM | POA: Diagnosis not present

## 2024-11-20 DIAGNOSIS — M1612 Unilateral primary osteoarthritis, left hip: Secondary | ICD-10-CM | POA: Diagnosis present

## 2024-11-20 DIAGNOSIS — G4730 Sleep apnea, unspecified: Secondary | ICD-10-CM | POA: Diagnosis not present

## 2024-11-20 DIAGNOSIS — R7303 Prediabetes: Secondary | ICD-10-CM | POA: Insufficient documentation

## 2024-11-20 HISTORY — PX: TOTAL HIP ARTHROPLASTY: SHX124

## 2024-11-20 LAB — TYPE AND SCREEN
ABO/RH(D): O POS
Antibody Screen: NEGATIVE

## 2024-11-20 SURGERY — ARTHROPLASTY, HIP, TOTAL, ANTERIOR APPROACH
Anesthesia: General | Site: Hip | Laterality: Left

## 2024-11-20 MED ORDER — FENTANYL CITRATE (PF) 100 MCG/2ML IJ SOLN
INTRAMUSCULAR | Status: AC
  Filled 2024-11-20: qty 2

## 2024-11-20 MED ORDER — OXYCODONE HCL 5 MG PO TABS
ORAL_TABLET | ORAL | Status: AC
  Filled 2024-11-20: qty 1

## 2024-11-20 MED ORDER — ACETAMINOPHEN 500 MG PO TABS
1000.0000 mg | ORAL_TABLET | Freq: Once | ORAL | Status: AC
  Administered 2024-11-20: 1000 mg via ORAL
  Filled 2024-11-20: qty 2

## 2024-11-20 MED ORDER — TRANEXAMIC ACID 1000 MG/10ML IV SOLN
INTRAVENOUS | Status: DC | PRN
  Administered 2024-11-20: 2000 mg via TOPICAL

## 2024-11-20 MED ORDER — CHLORHEXIDINE GLUCONATE 0.12 % MT SOLN
15.0000 mL | Freq: Once | OROMUCOSAL | Status: AC
  Administered 2024-11-20: 15 mL via OROMUCOSAL

## 2024-11-20 MED ORDER — SODIUM CHLORIDE 0.9 % IR SOLN
Status: DC | PRN
  Administered 2024-11-20: 1000 mL

## 2024-11-20 MED ORDER — CEFAZOLIN SODIUM-DEXTROSE 2-4 GM/100ML-% IV SOLN
2.0000 g | INTRAVENOUS | Status: AC
  Administered 2024-11-20: 2 g via INTRAVENOUS
  Filled 2024-11-20: qty 100

## 2024-11-20 MED ORDER — LACTATED RINGERS IV BOLUS: 250.0000 mL | Freq: Once | INTRAVENOUS | Status: DC

## 2024-11-20 MED ORDER — OXYCODONE HCL 5 MG/5ML PO SOLN: 5.0000 mg | Freq: Once | ORAL | Status: AC | PRN

## 2024-11-20 MED ORDER — LIDOCAINE HCL (PF) 2 % IJ SOLN
INTRAMUSCULAR | Status: AC
  Filled 2024-11-20: qty 5

## 2024-11-20 MED ORDER — LACTATED RINGERS IV SOLN: INTRAVENOUS | Status: DC

## 2024-11-20 MED ORDER — POVIDONE-IODINE 10 % EX SWAB: 2.0000 | Freq: Once | CUTANEOUS | Status: DC

## 2024-11-20 MED ORDER — PROPOFOL 10 MG/ML IV BOLUS
INTRAVENOUS | Status: DC | PRN
  Administered 2024-11-20: 130 mg via INTRAVENOUS

## 2024-11-20 MED ORDER — METHOCARBAMOL 1000 MG/10ML IJ SOLN: 500.0000 mg | Freq: Four times a day (QID) | INTRAMUSCULAR | Status: DC | PRN

## 2024-11-20 MED ORDER — BUPIVACAINE-MELOXICAM ER 200-6 MG/7ML IJ SOLN
INTRAMUSCULAR | Status: DC | PRN
  Administered 2024-11-20: 400 mg

## 2024-11-20 MED ORDER — METHOCARBAMOL 500 MG PO TABS
ORAL_TABLET | ORAL | Status: AC
  Filled 2024-11-20: qty 1

## 2024-11-20 MED ORDER — OXYCODONE HCL 5 MG PO TABS
5.0000 mg | ORAL_TABLET | Freq: Once | ORAL | Status: AC | PRN
  Administered 2024-11-20: 5 mg via ORAL

## 2024-11-20 MED ORDER — KETAMINE HCL 50 MG/5ML IJ SOSY
PREFILLED_SYRINGE | INTRAMUSCULAR | Status: AC
  Filled 2024-11-20: qty 5

## 2024-11-20 MED ORDER — VANCOMYCIN HCL 1 G IV SOLR
INTRAVENOUS | Status: DC | PRN
  Administered 2024-11-20: 1000 mg via TOPICAL

## 2024-11-20 MED ORDER — DEXAMETHASONE SOD PHOSPHATE PF 10 MG/ML IJ SOLN
INTRAMUSCULAR | Status: DC | PRN
  Administered 2024-11-20: 10 mg via INTRAVENOUS

## 2024-11-20 MED ORDER — ROCURONIUM BROMIDE 100 MG/10ML IV SOLN
INTRAVENOUS | Status: DC | PRN
  Administered 2024-11-20: 60 mg via INTRAVENOUS

## 2024-11-20 MED ORDER — AMISULPRIDE (ANTIEMETIC) 5 MG/2ML IV SOLN: 10.0000 mg | Freq: Once | INTRAVENOUS | Status: DC | PRN

## 2024-11-20 MED ORDER — BUPIVACAINE-MELOXICAM ER 400-12 MG/14ML IJ SOLN
INTRAMUSCULAR | Status: AC
  Filled 2024-11-20: qty 1

## 2024-11-20 MED ORDER — ISOPROPYL ALCOHOL 70 % SOLN
Status: DC | PRN
  Administered 2024-11-20: 1 via TOPICAL

## 2024-11-20 MED ORDER — SUGAMMADEX SODIUM 200 MG/2ML IV SOLN
INTRAVENOUS | Status: DC | PRN
  Administered 2024-11-20: 200 mg via INTRAVENOUS

## 2024-11-20 MED ORDER — ONDANSETRON HCL 4 MG/2ML IJ SOLN
INTRAMUSCULAR | Status: AC
  Filled 2024-11-20: qty 2

## 2024-11-20 MED ORDER — 0.9 % SODIUM CHLORIDE (POUR BTL) OPTIME
TOPICAL | Status: DC | PRN
  Administered 2024-11-20: 1000 mL

## 2024-11-20 MED ORDER — KETAMINE HCL 10 MG/ML IJ SOLN
INTRAMUSCULAR | Status: DC | PRN
  Administered 2024-11-20: 30 mg via INTRAVENOUS

## 2024-11-20 MED ORDER — TRANEXAMIC ACID-NACL 1000-0.7 MG/100ML-% IV SOLN
1000.0000 mg | Freq: Once | INTRAVENOUS | Status: AC
  Administered 2024-11-20: 1000 mg via INTRAVENOUS

## 2024-11-20 MED ORDER — ORAL CARE MOUTH RINSE: 15.0000 mL | Freq: Once | OROMUCOSAL | Status: AC

## 2024-11-20 MED ORDER — LACTATED RINGERS IV BOLUS
500.0000 mL | Freq: Once | INTRAVENOUS | Status: AC
  Administered 2024-11-20: 500 mL via INTRAVENOUS

## 2024-11-20 MED ORDER — FENTANYL CITRATE (PF) 100 MCG/2ML IJ SOLN
INTRAMUSCULAR | Status: DC | PRN
  Administered 2024-11-20 (×3): 50 ug via INTRAVENOUS

## 2024-11-20 MED ORDER — MIDAZOLAM HCL 2 MG/2ML IJ SOLN
INTRAMUSCULAR | Status: AC
  Filled 2024-11-20: qty 2

## 2024-11-20 MED ORDER — TRANEXAMIC ACID-NACL 1000-0.7 MG/100ML-% IV SOLN
1000.0000 mg | INTRAVENOUS | Status: AC
  Administered 2024-11-20: 1000 mg via INTRAVENOUS
  Filled 2024-11-20: qty 100

## 2024-11-20 MED ORDER — IBUPROFEN 800 MG PO TABS: 800.0000 mg | ORAL_TABLET | Freq: Three times a day (TID) | ORAL | 2 refills | Status: AC | PRN

## 2024-11-20 MED ORDER — VANCOMYCIN HCL 1000 MG IV SOLR
INTRAVENOUS | Status: AC
  Filled 2024-11-20: qty 20

## 2024-11-20 MED ORDER — PHENYLEPHRINE HCL (PRESSORS) 10 MG/ML IV SOLN
INTRAVENOUS | Status: DC | PRN
  Administered 2024-11-20 (×3): 80 ug via INTRAVENOUS

## 2024-11-20 MED ORDER — ONDANSETRON HCL 4 MG/2ML IJ SOLN
INTRAMUSCULAR | Status: DC | PRN
  Administered 2024-11-20: 4 mg via INTRAVENOUS

## 2024-11-20 MED ORDER — TRANEXAMIC ACID-NACL 1000-0.7 MG/100ML-% IV SOLN
INTRAVENOUS | Status: AC
  Filled 2024-11-20: qty 100

## 2024-11-20 MED ORDER — MIDAZOLAM HCL 5 MG/5ML IJ SOLN
INTRAMUSCULAR | Status: DC | PRN
  Administered 2024-11-20: 1 mg via INTRAVENOUS

## 2024-11-20 MED ORDER — FENTANYL CITRATE (PF) 50 MCG/ML IJ SOSY
PREFILLED_SYRINGE | INTRAMUSCULAR | Status: AC
  Filled 2024-11-20: qty 2

## 2024-11-20 MED ORDER — FENTANYL CITRATE (PF) 50 MCG/ML IJ SOSY
25.0000 ug | PREFILLED_SYRINGE | INTRAMUSCULAR | Status: DC | PRN
  Administered 2024-11-20 (×2): 50 ug via INTRAVENOUS

## 2024-11-20 MED ORDER — METHOCARBAMOL 500 MG PO TABS
500.0000 mg | ORAL_TABLET | Freq: Four times a day (QID) | ORAL | Status: DC | PRN
  Administered 2024-11-20: 500 mg via ORAL

## 2024-11-20 MED ORDER — CEFAZOLIN SODIUM-DEXTROSE 2-4 GM/100ML-% IV SOLN: 2.0000 g | Freq: Four times a day (QID) | INTRAVENOUS | Status: DC

## 2024-11-20 MED ORDER — PHENYLEPHRINE HCL-NACL 20-0.9 MG/250ML-% IV SOLN
INTRAVENOUS | Status: AC
  Filled 2024-11-20: qty 500

## 2024-11-20 MED ORDER — STERILE WATER FOR IRRIGATION IR SOLN
Status: DC | PRN
  Administered 2024-11-20: 2000 mL

## 2024-11-20 MED ORDER — CEFAZOLIN SODIUM-DEXTROSE 2-4 GM/100ML-% IV SOLN
INTRAVENOUS | Status: AC
  Filled 2024-11-20: qty 100

## 2024-11-20 MED ORDER — LIDOCAINE HCL (CARDIAC) PF 100 MG/5ML IV SOSY
PREFILLED_SYRINGE | INTRAVENOUS | Status: DC | PRN
  Administered 2024-11-20: 60 mg via INTRAVENOUS

## 2024-11-20 SURGICAL SUPPLY — 41 items
BAG COUNTER SPONGE SURGICOUNT (BAG) IMPLANT
BAG ZIPLOCK 12X15 (MISCELLANEOUS) ×1 IMPLANT
BLADE SAG 18X100X1.27 (BLADE) ×1 IMPLANT
CLSR STERI-STRIP ANTIMIC 1/2X4 (GAUZE/BANDAGES/DRESSINGS) IMPLANT
COVER PERINEAL POST (MISCELLANEOUS) ×1 IMPLANT
COVER SURGICAL LIGHT HANDLE (MISCELLANEOUS) ×1 IMPLANT
DRAPE FOOT SWITCH (DRAPES) ×1 IMPLANT
DRAPE IMP U-DRAPE 54X76 (DRAPES) ×1 IMPLANT
DRAPE POUCH INSTRU U-SHP 10X18 (DRAPES) ×1 IMPLANT
DRAPE STERI IOBAN 125X83 (DRAPES) ×1 IMPLANT
DRAPE TOP 10253 STERILE (DRAPES) ×2 IMPLANT
DRAPE U-SHAPE 47X51 STRL (DRAPES) ×1 IMPLANT
DRSG AQUACEL AG ADV 3.5X10 (GAUZE/BANDAGES/DRESSINGS) ×1 IMPLANT
DURAPREP 26ML APPLICATOR (WOUND CARE) ×2 IMPLANT
ELECT PENCIL ROCKER SW 15FT (MISCELLANEOUS) ×1 IMPLANT
ELECT REM PT RETURN 15FT ADLT (MISCELLANEOUS) ×1 IMPLANT
GLOVE BIOGEL PI IND STRL 7.0 (GLOVE) ×1 IMPLANT
GLOVE BIOGEL PI IND STRL 7.5 (GLOVE) ×1 IMPLANT
GLOVE ECLIPSE 7.0 STRL STRAW (GLOVE) ×3 IMPLANT
GLOVE SURG SYN 7.5 PF PI (GLOVE) ×3 IMPLANT
GOWN SRG XL LVL 4 BRTHBL STRL (GOWNS) ×2 IMPLANT
HEAD CERAMIC 36 PLUS 8.5 12 14 (Hips) IMPLANT
HOOD PEEL AWAY T7 (MISCELLANEOUS) ×3 IMPLANT
KIT TURNOVER KIT A (KITS) ×1 IMPLANT
LINER NEUTRAL 52X36MM PLUS 4 (Liner) IMPLANT
MARKER SKIN DUAL TIP RULER LAB (MISCELLANEOUS) ×1 IMPLANT
NDL SPNL 18GX3.5 QUINCKE PK (NEEDLE) ×1 IMPLANT
NEEDLE SPNL 18GX3.5 QUINCKE PK (NEEDLE) ×1 IMPLANT
PACK ANTERIOR HIP CUSTOM (KITS) ×1 IMPLANT
PIN SECTOR W/GRIP ACE CUP 52MM (Hips) IMPLANT
SET HNDPC FAN SPRY TIP SCT (DISPOSABLE) ×1 IMPLANT
SOLUTION PRONTOSAN WOUND 350ML (IRRIGATION / IRRIGATOR) ×1 IMPLANT
STEM FEM SZ3 STD ACTIS (Stem) IMPLANT
SUT ETHIBOND 2 V 37 (SUTURE) ×1 IMPLANT
SUT ETHILON 2 0 PS N (SUTURE) ×1 IMPLANT
SUT MNCRL AB 3-0 PS2 18 (SUTURE) IMPLANT
SUT NYLON 3 0 (SUTURE) IMPLANT
SUT STRATAFIX PDS+ 0 24IN (SUTURE) ×1 IMPLANT
SUT VIC AB 2-0 CT1 TAPERPNT 27 (SUTURE) ×2 IMPLANT
TRAY CATH INTERMITTENT SS 16FR (CATHETERS) IMPLANT
TUBE SUCTION HIGH CAP CLEAR NV (SUCTIONS) ×1 IMPLANT

## 2024-11-20 NOTE — Op Note (Signed)
 "  LEFT TOTAL HIP ARTHROPLASTY, ANTERIOR APPROACH  Procedure Note JAMIEL GONCALVES   [id number]  Pre-op Diagnosis: left hip osteoarthritis     Post-op Diagnosis: same  Operative Findings Severe OA   Operative Procedures  1. Total hip replacement; Left hip; uncemented [id number]   Surgeon: Kay Cummins, M.D.  Assist: Ronal Morna Grave, PA-C   Anesthesia: general  Prosthesis: Depuy Acetabulum: Pinnacle 52 mm Femur: Actis 3 STD Head: 36 mm size: +8.5 Liner: +4 neutral Bearing Type: ceramic/poly  Total Hip Arthroplasty (Anterior Approach) Op Note:  After informed consent was obtained and the operative extremity marked in the holding area, the patient was brought back to the operating room and placed supine on the HANA table. Next, the operative extremity was prepped and draped in normal sterile fashion. Surgical timeout occurred verifying patient identification, surgical site, surgical procedure and administration of antibiotics.  A 10 cm longitudinal incision was made starting from 2 fingerbreadths lateral and inferior to the ASIS towards the lateral aspect of the patella.  A Hueter approach to the hip was performed, using the interval between tensor fascia lata and sartorius.  Dissection was carried bluntly down onto the anterior hip capsule. The lateral femoral circumflex vessels were identified and coagulated. A capsulotomy was performed and the capsular flaps tagged for later repair.  The neck osteotomy was performed 1 fingerbreadth above the lesser trochanter. The femoral head was removed which showed severe wear, the acetabular rim was cleared of soft tissue and osteophytes and attention was turned to reaming the acetabulum.  Sequential reaming was performed under fluoroscopic guidance down to the floor of the cotyloid fossa. We reamed to a size 51 mm, and then impacted the acetabular shell.   A +4 neutral liner was then placed after irrigation and attention turned to the femur.   After placing the femoral hook, the leg was taken to externally rotated, extended and adducted position taking care to perform soft tissue releases to allow for adequate mobilization of the femur. Soft tissue was cleared from the shoulder of the greater trochanter and the hook elevator used to improve exposure of the proximal femur.  Lateral bone from the shoulder was rasped away for relief.  Sequential broaching performed up to a size 3.  Standard trial neck and +1.5 head were placed. The leg was brought back up to neutral and the construct reduced.  The position and sizing of components, offset and leg lengths were checked using fluoroscopy.  Based on fluoroscopic findings, we chose to retrial with standard neck and +8.5 head ball which better restored leg length and offset.  Stability of the construct was checked in 45 degrees of hip extension and 90 degrees of external rotation without any subluxation, shuck or impingement of prosthesis. We dislocated the prosthesis, dropped the leg back into position, removed trial components, and irrigated copiously. The final stem and head were chosen then placed, the leg brought back up, the system reduced and fluoroscopy used to verify positioning.  Antibiotic irrigation was placed in the surgical wound.   We irrigated, obtained hemostasis and closed the capsule using #2 ethibond suture.  A topical mixture of 0.25% bupivacaine  and meloxicam  was placed deep to the fascia.  One gram of vancomycin  powder was placed in the surgical bed.   One gram of topical tranexamic acid  was injected into the joint.  The fascia was closed with #1 stratafix, the deep fat layer was closed with 0 vicryl, the subcutaneous layers closed with 2.0 Vicryl Plus  and the skin closed with 3.0 monocryl and steri strips. A sterile dressing was applied. The patient was awakened in the operating room and taken to recovery in stable condition.  All sponge, needle, and instrument counts were correct at  the end of the case.   Morna Grave, my PA, was a medical necessity for opening, closing, limb positioning, retracting, exposing, and overall facilitation and timely completion of the surgery.  Position: supine  Complications: see description of procedure.  Time Out: performed   Drains/Packing: none  Estimated blood loss: see anesthesia record  Returned to Recovery Room: in good condition.   Antibiotics: yes   Mechanical VTE (DVT) Prophylaxis: sequential compression devices, TED thigh-high  Chemical VTE (DVT) Prophylaxis: aspirin  POD 0   Fluid Replacement: see anesthesia record  Specimens Removed: 1 to pathology   Sponge and Instrument Count Correct? yes   PACU: portable radiograph - low AP   Plan/RTC: Return in 2 weeks for suture removal. Weight Bearing/Load Lower Extremity: full  Hip precautions: none Suture Removal: 2 weeks   N. Ozell Cummins, MD [facility] 8:55 AM   Implant Name Type Inv. Item Serial No. Manufacturer Lot No. LRB No. Used Action  PIN SECTOR W/GRIP ACE CUP - [id number] Hips PIN SECTOR W/GRIP ACE CUP  [employer] [id number] Left 1 Implanted  LINER NEUTRAL [id number] PLUS 4 - [id number] Liner LINER NEUTRAL [id number] PLUS 4  [employer]TH[employer] [id number] Left 1 Implanted  STEM FEM SZ3 STD ACTIS - [id number] Stem STEM FEM SZ3 STD ACTIS  [employer] [id number] Left 1 Implanted  HEAD CERAMIC 36 PLUS 8.5 12 14  - [id number] Hips HEAD CERAMIC 36 PLUS 8.5 12 14   [employer] [id number] Left 1 Implanted   "

## 2024-11-20 NOTE — Anesthesia Postprocedure Evaluation (Signed)
"   Anesthesia Post Note  Patient: Rodney Dougherty  Procedure(s) Performed: LEFT TOTAL HIP ARTHROPLASTY, ANTERIOR APPROACH (Left: Hip)     Patient location during evaluation: PACU Anesthesia Type: General Level of consciousness: awake and alert Pain management: pain level controlled Vital Signs Assessment: post-procedure vital signs reviewed and stable Respiratory status: spontaneous breathing, nonlabored ventilation, respiratory function stable and patient connected to nasal cannula oxygen Cardiovascular status: blood pressure returned to baseline and stable Postop Assessment: no apparent nausea or vomiting Anesthetic complications: no   No notable events documented.  Last Vitals:  Vitals:   11/20/24 1045 11/20/24 1225  BP: (!) 160/72 (!) 136/90  Pulse: 80 76  Resp:  15  Temp:  36.6 C  SpO2: 99% 95%    Last Pain:  Vitals:   11/20/24 1225  TempSrc:   PainSc: 2                  [name] [name]      "

## 2024-11-20 NOTE — Discharge Instructions (Signed)

## 2024-11-20 NOTE — Evaluation (Signed)
 " Physical Therapy Evaluation Patient Details Name: Rodney Dougherty MRN: [id number] DOB: 1956-11-18 Today's Date: 11/20/2024  History of Present Illness  Patient is 68 y.o. male s/p L THA anterior approach on 11/11/2024 due to failure of conservative measures. Pt PMH includes but is not limited to: OA, HLD, back surgery, hernia repair,  and R THA anterior approach on 11/14/2020.  Clinical Impression      Rodney Dougherty is a 68 y.o. male POD 0 s/p L THA. Patient reports mod I with mobility at baseline. Patient is now limited by functional impairments (see PT problem list below) and requires CGA for transfers and gait with RW. Patient was able to ambulate 60 feet with RW and CGA and cues for safe walker management. Patient educated on safe sequencing for stair mobility with use of RW, fall risk prevention, pain management and goal, use of CP/ice, slowly increasing activity level and car transfers pt and son verbalized understanding of safe guarding position for people assisting with mobility. Patient instructed in exercises to facilitate ROM and circulation reviewed and HO provided. Patient will benefit from continued skilled PT interventions to address impairments and progress towards PLOF. Patient has met mobility goals at adequate level for discharge home with family support and HEP; will continue to follow if pt continues acute stay to progress towards Mod I goals.      If plan is discharge home, recommend the following: A little help with walking and/or transfers;A little help with bathing/dressing/bathroom;Assistance with cooking/housework;Assist for transportation;Help with stairs or ramp for entrance   Can travel by private vehicle        Equipment Recommendations None recommended by PT  Recommendations for Other Services       Functional Status Assessment Patient has had a recent decline in their functional status and demonstrates the ability to make significant improvements in  function in a reasonable and predictable amount of time.     Precautions / Restrictions Precautions Precautions: Fall Restrictions Weight Bearing Restrictions Per Provider Order: No      Mobility  Bed Mobility Overal bed mobility: Needs Assistance Bed Mobility: Supine to Sit     Supine to sit: Contact guard, HOB elevated     General bed mobility comments: min cues    Transfers Overall transfer level: Needs assistance Equipment used: Rolling walker (2 wheels) Transfers: Sit to/from Stand Sit to Stand: Contact guard assist           General transfer comment: min cues    Ambulation/Gait Ambulation/Gait assistance: Contact guard assist, Supervision Gait Distance (Feet): 60 Feet Assistive device: Rolling walker (2 wheels) Gait Pattern/deviations: Step-through pattern, Decreased stance time - left, Antalgic, Trunk flexed Gait velocity: decreased     General Gait Details: step almost through B LE with good foot clearance, slight trunk flexion with B UE support at RW, pt demonstrated lateral sway and required cues for safety, sequencing and RW management  Stairs Stairs: Yes Stairs assistance: Contact guard assist Stair Management: Two rails Number of Stairs: 2 General stair comments: cues for safety, step to pattern and technique with B handrails and verbal instruction provided on use of RW to navigate one step to enter home with son and pt verbalizing understanding  Wheelchair Mobility     Tilt Bed    Modified Rankin (Stroke Patients Only)       Balance Overall balance assessment: Needs assistance Sitting-balance support: Feet supported Sitting balance-Leahy Scale: Good     Standing balance support: Bilateral upper extremity  supported, During functional activity, Reliant on assistive device for balance Standing balance-Leahy Scale: Fair Standing balance comment: static standing no UE support                             Pertinent  Vitals/Pain Pain Assessment Pain Assessment: 0-10 Pain Score: 6  Pain Location: L hip and LE Pain Descriptors / Indicators: Aching, Discomfort, Grimacing, Operative site guarding Pain Intervention(s): Limited activity within patient's tolerance, Monitored during session, Premedicated before session, Repositioned, Ice applied    Home Living Family/patient expects to be discharged to:: Private residence Living Arrangements: Alone Available Help at Discharge: Family Type of Home: House Home Access: Stairs to enter Entrance Stairs-Rails: None Entrance Stairs-Number of Steps: 1   Home Layout: One level Home Equipment: Agricultural Consultant (2 wheels);Cane - single point      Prior Function Prior Level of Function : Independent/Modified Independent             Mobility Comments: mod I with use of SPC for all ADLs, self care tasks, and IADLs       Extremity/Trunk Assessment        Lower Extremity Assessment Lower Extremity Assessment: LLE deficits/detail LLE Deficits / Details: ankle DF/PF 5/5 LLE Sensation: WNL    Cervical / Trunk Assessment Cervical / Trunk Assessment: Back Surgery  Communication   Communication Communication: No apparent difficulties    Cognition Arousal: Alert Behavior During Therapy: WFL for tasks assessed/performed   PT - Cognitive impairments: No apparent impairments                         Following commands: Intact       Cueing       General Comments      Exercises Total Joint Exercises Ankle Circles/Pumps: AROM, Both, 10 reps Quad Sets: AROM, Left, 5 reps Heel Slides: AROM, Left, 5 reps Hip ABduction/ADduction: AROM, Left, 5 reps, Standing Long Arc Quad: AROM, Left, 5 reps, Seated Knee Flexion: AROM, Left, 5 reps, Standing Marching in Standing: AROM, 5 reps, Standing, Left Standing Hip Extension: AROM, Left, 5 reps, Standing   Assessment/Plan    PT Assessment Patient needs continued PT services  PT Problem List  Decreased strength;Decreased range of motion;Decreased activity tolerance;Decreased balance;Decreased mobility;Pain       PT Treatment Interventions DME instruction;Gait training;Stair training;Functional mobility training;Therapeutic activities;Therapeutic exercise;Balance training;Neuromuscular re-education;Patient/family education;Modalities    PT Goals (Current goals can be found in the Care Plan section)  Acute Rehab PT Goals Patient Stated Goal: dance in the rain, win the lottery PT Goal Formulation: With patient Time For Goal Achievement: 12/04/24 Potential to Achieve Goals: Good    Frequency 7X/week     Co-evaluation               AM-PAC PT 6 Clicks Mobility  Outcome Measure Help needed turning from your back to your side while in a flat bed without using bedrails?: None Help needed moving from lying on your back to sitting on the side of a flat bed without using bedrails?: A Little Help needed moving to and from a bed to a chair (including a wheelchair)?: A Little Help needed standing up from a chair using your arms (e.g., wheelchair or bedside chair)?: A Little Help needed to walk in hospital room?: A Little Help needed climbing 3-5 steps with a railing? : A Little 6 Click Score: 19    End of Session  Equipment Utilized During Treatment: Gait belt Activity Tolerance: Patient tolerated treatment well     PT Visit Diagnosis: Unsteadiness on feet (R26.81);Other abnormalities of gait and mobility (R26.89);Muscle weakness (generalized) (M62.81);Pain;Difficulty in walking, not elsewhere classified (R26.2) Pain - Right/Left: Left Pain - part of body: Hip;Leg    Time: 8843-8775 PT Time Calculation (min) (ACUTE ONLY): 28 min   Charges:   PT Evaluation $PT Eval Low Complexity: 1 Low PT Treatments $Gait Training: 8-22 mins PT General Charges $$ ACUTE PT VISIT: 1 Visit         [place], PT Acute Rehab   [place] VEAR Drone 11/20/2024, 12:45 PM "

## 2024-11-20 NOTE — Anesthesia Procedure Notes (Signed)
 Procedure Name: Intubation Date/Time: 11/20/2024 7:40 AM  Performed by: Buster Catheryn SAUNDERS, CRNAPre-anesthesia Checklist: Patient identified, Emergency Drugs available, Suction available and Patient being monitored Patient Re-evaluated:Patient Re-evaluated prior to induction Oxygen Delivery Method: Circle system utilized Preoxygenation: Pre-oxygenation with 100% oxygen Induction Type: IV induction Ventilation: Mask ventilation without difficulty Laryngoscope Size: Miller and 2 Grade View: Grade II Tube type: Oral Tube size: 7.0 mm Number of attempts: 1 Airway Equipment and Method: Stylet and Oral airway Placement Confirmation: ETT inserted through vocal cords under direct vision, positive ETCO2 and breath sounds checked- equal and bilateral Secured at: 22 cm Tube secured with: Tape Dental Injury: Teeth and Oropharynx as per pre-operative assessment

## 2024-11-20 NOTE — Transfer of Care (Signed)
 Immediate Anesthesia Transfer of Care Note  Patient: Rodney Dougherty [place]  Procedure(s) Performed: LEFT TOTAL HIP ARTHROPLASTY, ANTERIOR APPROACH (Left: Hip)  Patient Location: PACU  Anesthesia Type:General  Level of Consciousness: awake, alert , and oriented  Airway & Oxygen Therapy: Patient Spontanous Breathing and Patient connected to face mask oxygen  Post-op Assessment: Report given to RN and Post -op Vital signs reviewed and stable  Post vital signs: Reviewed and stable  Last Vitals:  Vitals Value Taken Time  BP 161/71 11/20/24 09:22  Temp    Pulse 70 11/20/24 09:24  Resp 14 11/20/24 09:24  SpO2 100 % 11/20/24 09:24  Vitals shown include unfiled device data.  Last Pain:  Vitals:   11/20/24 0540  TempSrc: Oral         Complications: No notable events documented.

## 2024-11-20 NOTE — Discharge Summary (Signed)
 "    Patient ID: Rodney Dougherty MRN: [id number] DOB/AGE: 1956/05/16 68 y.o.  Admit date: 11/20/2024 Discharge date: 11/20/2024  Admission Diagnoses:  Primary osteoarthritis of left hip  Discharge Diagnoses:  Principal Problem:   Primary osteoarthritis of left hip   Past Medical History:  Diagnosis Date   Arthritis    Hyperlipidemia    Pre-diabetes    Skin cancer    Face   Sleep apnea    Does not wear CPAP    Surgeries: Procedures: LEFT TOTAL HIP ARTHROPLASTY, ANTERIOR APPROACH on 11/20/2024   Consultants (if any):   Discharged Condition: Improved  Hospital Course: Rodney Dougherty is an 68 y.o. male who was admitted 11/20/2024 with a diagnosis of Primary osteoarthritis of left hip and went to the operating room on 11/20/2024 and underwent the above named procedures.    He was given perioperative antibiotics:  Anti-infectives (From admission, onward)    Start     Dose/Rate Route Frequency Ordered Stop   11/20/24 1400  ceFAZolin  (ANCEF ) IVPB 2g/100 mL premix        2 g 200 mL/hr over 30 Minutes Intravenous Every 6 hours 11/20/24 1032 11/21/24 0159   11/20/24 0720  vancomycin  (VANCOCIN ) powder  Status:  Discontinued          As needed 11/20/24 0721 11/20/24 0919   11/20/24 0600  ceFAZolin  (ANCEF ) IVPB 2g/100 mL premix        2 g 200 mL/hr over 30 Minutes Intravenous On call to O.R. 11/20/24 [id number] 11/20/24 0744     .  He was given sequential compression devices, early ambulation, and appropriate chemoprophylaxis for DVT prophylaxis.  He benefited maximally from the hospital stay and there were no complications.    Recent vital signs:  Vitals:   11/20/24 1045 11/20/24 1225  BP: (!) 160/72 (!) 136/90  Pulse: 80 76  Resp:  15  Temp:  97.9 F (36.6 C)  SpO2: 99% 95%    Recent laboratory studies:  Lab Results  Component Value Date   HGB 15.5 11/11/2024   HGB 14.4 03/09/2021   HGB 12.0 (L) 11/15/2020   Lab Results  Component Value Date   WBC 7.5  11/11/2024   PLT 271 11/11/2024   Lab Results  Component Value Date   INR 1.0 11/11/2020   Lab Results  Component Value Date   NA 141 11/11/2024   K 4.6 11/11/2024   CL 106 11/11/2024   CO2 26 11/11/2024   BUN 16 11/11/2024   CREATININE 0.91 11/11/2024   GLUCOSE 115 (H) 11/11/2024    Discharge Medications:   Allergies as of 11/20/2024   No Known Allergies      Medication List     TAKE these medications    aspirin  81 MG chewable tablet Commonly known as: Aspirin  81 Chew 1 tablet (81 mg total) by mouth 2 (two) times daily. To be taken after surgery to prevent blood clots   docusate sodium  100 MG capsule Commonly known as: Colace Take 1 capsule (100 mg total) by mouth daily as needed.   ibuprofen  800 MG tablet Commonly known as: ADVIL  Take 1 tablet (800 mg total) by mouth every 8 (eight) hours as needed.   methocarbamol  750 MG tablet Commonly known as: ROBAXIN  Take 1 tablet (750 mg total) by mouth 3 (three) times daily as needed.   ondansetron  4 MG tablet Commonly known as: Zofran  Take 1 tablet (4 mg total) by mouth every 8 (eight) hours as needed for  nausea or vomiting.   oxyCODONE -acetaminophen  5-325 MG tablet Commonly known as: Percocet Take 1-2 tablets by mouth every 6 (six) hours as needed. To be taken after surgery   rosuvastatin  10 MG tablet Commonly known as: CRESTOR  Take 1 tablet (10 mg total) by mouth daily.        Diagnostic Studies: DG Pelvis Portable Result Date: 11/20/2024 CLINICAL DATA:  Postop in PACU. EXAM: DG PORTABLE PELVIS COMPARISON:  09/22/2024 FINDINGS: Left hip arthroplasty in expected alignment. No periprosthetic lucency or fracture. Recent postsurgical change includes air and edema in the soft tissues. Previous right hip arthroplasty. Chronic ossific density superior to the right greater trochanter. IMPRESSION: Left hip arthroplasty without immediate postoperative complication. Electronically Signed   By: Andrea Gasman M.D.    On: 11/20/2024 11:29   DG HIP UNILAT WITH PELVIS 2-3 VIEWS LEFT Result Date: 11/20/2024 CLINICAL DATA:  Elective surgery. EXAM: DG HIP (WITH OR WITHOUT PELVIS) 2-3V LEFT COMPARISON:  None Available. FINDINGS: Two fluoroscopic spot views of the pelvis and left hip obtained in the operating room. Images during hip arthroplasty. Fluoroscopy time 20 seconds. Dose 3.47 mGy. IMPRESSION: Intraoperative fluoroscopy during left hip arthroplasty. Electronically Signed   By: Andrea Gasman M.D.   On: 11/20/2024 11:27   DG C-Arm 1-60 Min-No Report Result Date: 11/20/2024 Fluoroscopy was utilized by the requesting physician.  No radiographic interpretation.   DG C-Arm 1-60 Min-No Report Result Date: 11/20/2024 Fluoroscopy was utilized by the requesting physician.  No radiographic interpretation.    Disposition: Discharge disposition: 01-Home or Self Care       Discharge Instructions     Call MD / Call 911   Complete by: As directed    If you experience chest pain or shortness of breath, CALL 911 and be transported to the hospital emergency room.  If you develope a fever above 101.5 F, pus (white drainage) or increased drainage or redness at the wound, or calf pain, call your surgeon's office.   Call MD / Call 911   Complete by: As directed    If you experience chest pain or shortness of breath, CALL 911 and be transported to the hospital emergency room.  If you develope a fever above 101.5 F, pus (white drainage) or increased drainage or redness at the wound, or calf pain, call your surgeon's office.   Constipation Prevention   Complete by: As directed    Drink plenty of fluids.  Prune juice may be helpful.  You may use a stool softener, such as Colace (over the counter) 100 mg twice a day.  Use MiraLax  (over the counter) for constipation as needed.   Constipation Prevention   Complete by: As directed    Drink plenty of fluids.  Prune juice may be helpful.  You may use a stool softener, such  as Colace (over the counter) 100 mg twice a day.  Use MiraLax  (over the counter) for constipation as needed.   Driving restrictions   Complete by: As directed    No driving while taking narcotic pain meds.   Driving restrictions   Complete by: As directed    No driving while taking narcotic pain meds.   Increase activity slowly as tolerated   Complete by: As directed    Increase activity slowly as tolerated   Complete by: As directed    Post-operative opioid taper instructions:   Complete by: As directed    POST-OPERATIVE OPIOID TAPER INSTRUCTIONS: It is important to wean off of your opioid medication  as soon as possible. If you do not need pain medication after your surgery it is ok to stop day one. Opioids include: Codeine, Hydrocodone(Norco, Vicodin), Oxycodone (Percocet, oxycontin ) and hydromorphone  amongst others.  Long term and even short term use of opiods can cause: Increased pain response Dependence Constipation Depression Respiratory depression And more.  Withdrawal symptoms can include Flu like symptoms Nausea, vomiting And more Techniques to manage these symptoms Hydrate well Eat regular healthy meals Stay active Use relaxation techniques(deep breathing, meditating, yoga) Do Not substitute Alcohol  to help with tapering If you have been on opioids for less than two weeks and do not have pain than it is ok to stop all together.  Plan to wean off of opioids This plan should start within one week post op of your joint replacement. Maintain the same interval or time between taking each dose and first decrease the dose.  Cut the total daily intake of opioids by one tablet each day Next start to increase the time between doses. The last dose that should be eliminated is the evening dose.      Post-operative opioid taper instructions:   Complete by: As directed    POST-OPERATIVE OPIOID TAPER INSTRUCTIONS: It is important to wean off of your opioid medication as soon  as possible. If you do not need pain medication after your surgery it is ok to stop day one. Opioids include: Codeine, Hydrocodone(Norco, Vicodin), Oxycodone (Percocet, oxycontin ) and hydromorphone  amongst others.  Long term and even short term use of opiods can cause: Increased pain response Dependence Constipation Depression Respiratory depression And more.  Withdrawal symptoms can include Flu like symptoms Nausea, vomiting And more Techniques to manage these symptoms Hydrate well Eat regular healthy meals Stay active Use relaxation techniques(deep breathing, meditating, yoga) Do Not substitute Alcohol  to help with tapering If you have been on opioids for less than two weeks and do not have pain than it is ok to stop all together.  Plan to wean off of opioids This plan should start within one week post op of your joint replacement. Maintain the same interval or time between taking each dose and first decrease the dose.  Cut the total daily intake of opioids by one tablet each day Next start to increase the time between doses. The last dose that should be eliminated is the evening dose.           Follow-up Information     Jule Ronal CROME, PA-C. Go on 12/04/2024.   Specialty: Orthopedic Surgery Why: at 10:00 am for your first post op office visit Contact information: [address] [place]  [place] [address] [phone]                  Signed: Ozell Cummins 11/20/2024, 3:47 PM  "

## 2024-11-20 NOTE — H&P (Signed)
 "   PREOPERATIVE H&P  Chief Complaint: left hip osteoarthritis  HPI: Rodney Dougherty is a 68 y.o. male who presents for surgical treatment of left hip osteoarthritis.  He denies any changes in medical history.  Past Surgical History:  Procedure Laterality Date   BACK SURGERY  2007   pt. reports it was about 20 yrs. ago, no problems since then  . Lower back   HAND TENDON SURGERY Left 1999   occupational injury involving artery as well   HERNIA REPAIR Left 1975   inguinal    INGUINAL HERNIA REPAIR Left 04/16/2017   Procedure: LAPAROSCOPIC LEFT INGUINAL HERNIA REPAIR;  Surgeon: Signe Mitzie LABOR, MD;  Location: [facility] OR;  Service: General;  Laterality: Left;   INGUINAL HERNIA REPAIR Left 03/14/2021   Procedure: HERNIA REPAIR LEFT INGUINAL ADULT;  Surgeon: Rubin Calamity, MD;  Location: [facility] ORS;  Service: General;  Laterality: Left;  90MIN/RM6   INSERTION OF MESH Left 04/16/2017   Procedure: INSERTION OF MESH;  Surgeon: Signe Mitzie LABOR, MD;  Location: [facility] OR;  Service: General;  Laterality: Left;   TOTAL HIP ARTHROPLASTY Right 11/14/2020   Procedure: RIGHT TOTAL HIP ARTHROPLASTY ANTERIOR APPROACH;  Surgeon: Jerri Kay HERO, MD;  Location: [facility] ORS;  Service: Orthopedics;  Laterality: Right;  request 3C bed   Social History   Socioeconomic History   Marital status: Single    Spouse name: Not on file   Number of children: Not on file   Years of education: Not on file   Highest education level: Not on file  Occupational History   Occupation: Full time  Tobacco Use   Smoking status: Never   Smokeless tobacco: Never  Vaping Use   Vaping status: Never Used  Substance and Sexual Activity   Alcohol  use: Not Currently    Comment: rarely   Drug use: No   Sexual activity: Not Currently  Other Topics Concern   Not on file  Social History Narrative   Not on file   Social Drivers of Health   Tobacco Use: Low Risk (11/20/2024)   Patient History    Smoking Tobacco  Use: Never    Smokeless Tobacco Use: Never    Passive Exposure: Not on file  Financial Resource Strain: Not on file  Food Insecurity: Not on file  Transportation Needs: Not on file  Physical Activity: Not on file  Stress: Not on file  Social Connections: Not on file  Depression (EYV7-0): Not on file  Alcohol  Screen: Not on file  Housing: Not on file  Utilities: Not on file  Health Literacy: Not on file   Family History  Problem Relation Age of Onset   Stroke Mother    Colon cancer Neg Hx    Colon polyps Neg Hx    Esophageal cancer Neg Hx    Stomach cancer Neg Hx    Rectal cancer Neg Hx    No Known Allergies Prior to Admission medications  Medication Sig Start Date End Date Taking? Authorizing Provider  aspirin  (ASPIRIN  81) 81 MG chewable tablet Chew 1 tablet (81 mg total) by mouth 2 (two) times daily. To be taken after surgery to prevent blood clots 11/11/24   Jule Ronal CROME, PA-C  docusate sodium  (COLACE) 100 MG capsule Take 1 capsule (100 mg total) by mouth daily as needed. 11/11/24 11/11/25  Jule Ronal CROME, PA-C  methocarbamol  (ROBAXIN ) 750 MG tablet Take 1 tablet (750 mg total) by mouth 3 (three) times daily as needed. 11/11/24  Jule Ronal CROME, PA-C  ondansetron  (ZOFRAN ) 4 MG tablet Take 1 tablet (4 mg total) by mouth every 8 (eight) hours as needed for nausea or vomiting. 11/11/24   Jule Ronal CROME, PA-C  oxyCODONE -acetaminophen  (PERCOCET) 5-325 MG tablet Take 1-2 tablets by mouth every 6 (six) hours as needed. To be taken after surgery 11/11/24   Jule Ronal CROME, PA-C  rosuvastatin  (CRESTOR ) 10 MG tablet Take 1 tablet (10 mg total) by mouth daily. 08/19/20  Yes Avram Barnie NOVAK, FNP     Positive ROS: All other systems have been reviewed and were otherwise negative with the exception of those mentioned in the HPI and as above.  Physical Exam: General: Alert, no acute distress Cardiovascular: No pedal edema Respiratory: No cyanosis, no use of accessory  musculature GI: abdomen soft Skin: No lesions in the area of chief complaint Neurologic: Sensation intact distally Psychiatric: Patient is competent for consent with normal mood and affect Lymphatic: no lymphedema  MUSCULOSKELETAL: exam stable  Assessment: left hip osteoarthritis  Plan: Plan for Procedures: ARTHROPLASTY, HIP, TOTAL, ANTERIOR APPROACH  The risks benefits and alternatives were discussed with the patient including but not limited to the risks of nonoperative treatment, versus surgical intervention including infection, bleeding, nerve injury,  blood clots, cardiopulmonary complications, morbidity, mortality, among others, and they were willing to proceed.   Ozell Cummins, MD 11/20/2024 6:47 AM  "

## 2024-11-23 ENCOUNTER — Encounter (HOSPITAL_COMMUNITY): Payer: Self-pay | Admitting: Orthopaedic Surgery

## 2024-12-04 ENCOUNTER — Ambulatory Visit: Admitting: Physician Assistant

## 2024-12-04 DIAGNOSIS — Z96642 Presence of left artificial hip joint: Secondary | ICD-10-CM

## 2024-12-04 NOTE — Progress Notes (Signed)
 "  Post-Op Visit Note   Patient: Rodney Dougherty           Date of Birth: May 28, 1956           MRN: [id number] Visit Date: 12/04/2024 PCP: Maree Isles, MD   Assessment & Plan:  Chief Complaint:  Chief Complaint  Patient presents with   Left Hip - Routine Post Op, Pain    L THA 11/20/2024   Visit Diagnoses:  1. History of total hip replacement, left     Plan: Patient is a pleasant 69 year old gentleman who comes in today 2 weeks status post left total hip replacement.  He has been doing well.  He notes occasional achiness but not take any medicine for pain.  He has been ambulating primarily without assistance but does carry a cane as needed.  He has not been taking a baby aspirin  twice daily for DVT prophylaxis.  Examination of his left hip reveals a fully healed surgical scar without complication.  Calves are soft nontender.  He is neurovascular intact distally.  At this point, he will start taking his baby aspirin  twice daily for another 4 weeks.  Advance with activity as tolerated.  Follow-up in 4 weeks for repeat evaluation and AP pelvis x-rays.  Call with concerns or questions.  Follow-Up Instructions: Return in about 4 weeks (around 01/01/2025).   Orders:  No orders of the defined types were placed in this encounter.  No orders of the defined types were placed in this encounter.   Imaging: No new imaging  PMFS History: Patient Active Problem List   Diagnosis Date Noted   Primary osteoarthritis of left hip 09/22/2024   Status post total replacement of right hip 11/14/2020   Prediabetes 08/19/2020   Hypercholesteremia 08/19/2020   Past Medical History:  Diagnosis Date   Arthritis    Hyperlipidemia    Pre-diabetes    Skin cancer    Face   Sleep apnea    Does not wear CPAP    Family History  Problem Relation Age of Onset   Stroke Mother    Colon cancer Neg Hx    Colon polyps Neg Hx    Esophageal cancer Neg Hx    Stomach cancer Neg Hx    Rectal cancer Neg Hx      Past Surgical History:  Procedure Laterality Date   BACK SURGERY  2007   pt. reports it was about 20 yrs. ago, no problems since then  . Lower back   HAND TENDON SURGERY Left 1999   occupational injury involving artery as well   HERNIA REPAIR Left 1975   inguinal    INGUINAL HERNIA REPAIR Left 04/16/2017   Procedure: LAPAROSCOPIC LEFT INGUINAL HERNIA REPAIR;  Surgeon: Signe Mitzie LABOR, MD;  Location: [facility] OR;  Service: General;  Laterality: Left;   INGUINAL HERNIA REPAIR Left 03/14/2021   Procedure: HERNIA REPAIR LEFT INGUINAL ADULT;  Surgeon: Rubin Calamity, MD;  Location: [facility];  Service: General;  Laterality: Left;  90MIN/RM6   INSERTION OF MESH Left 04/16/2017   Procedure: INSERTION OF MESH;  Surgeon: Signe Mitzie LABOR, MD;  Location: [facility] OR;  Service: General;  Laterality: Left;   TOTAL HIP ARTHROPLASTY Right 11/14/2020   Procedure: RIGHT TOTAL HIP ARTHROPLASTY ANTERIOR APPROACH;  Surgeon: Jerri Kay HERO, MD;  Location: [facility];  Service: Orthopedics;  Laterality: Right;  request 3C bed   TOTAL HIP ARTHROPLASTY Left 11/20/2024   Procedure: LEFT TOTAL HIP ARTHROPLASTY, ANTERIOR APPROACH;  Surgeon: Jerri,  Kay HERO, MD;  Location: [facility];  Service: Orthopedics;  Laterality: Left;   Social History   Occupational History   Occupation: Full time  Tobacco Use   Smoking status: Never   Smokeless tobacco: Never  Vaping Use   Vaping status: Never Used  Substance and Sexual Activity   Alcohol  use: Not Currently    Comment: rarely   Drug use: No   Sexual activity: Not Currently     "

## 2025-01-05 ENCOUNTER — Ambulatory Visit: Admitting: Physician Assistant

## 2025-01-05 ENCOUNTER — Other Ambulatory Visit: Payer: Self-pay

## 2025-01-05 DIAGNOSIS — Z96642 Presence of left artificial hip joint: Secondary | ICD-10-CM

## 2025-01-05 NOTE — Progress Notes (Signed)
 "  Post-Op Visit Note   Patient: Rodney Dougherty           Date of Birth: 1956/10/10           MRN: [id number] Visit Date: 01/05/2025 PCP: Maree Isles, MD   Assessment & Plan:  Chief Complaint:  Chief Complaint  Patient presents with   Left Hip - Pain, Routine Post Op    L THA 11/20/2024   Visit Diagnoses:  1. History of total hip replacement, left     Plan: Patient is a pleasant 69 year old gentleman who comes in today little over 6 weeks status post left total hip replacement.  He has been doing well.  No complaints.  Not taking anything for pain.  He has finished his aspirin  for which he was taking for DVT prophylaxis.  Examination left hip reveals painless hip flexion and logroll.  He is neurovascular intact distally.  At this point, he will continue to advance with activity as tolerated.  Follow-up in 6 weeks for recheck.  Call with concerns or questions.  Follow-Up Instructions: Return in about 6 weeks (around 02/16/2025).   Orders:  Orders Placed This Encounter  Procedures   XR Pelvis 1-2 Views   No orders of the defined types were placed in this encounter.   Imaging: XR Pelvis 1-2 Views Result Date: 01/05/2025 Well-seated prosthesis without complication   PMFS History: Patient Active Problem List   Diagnosis Date Noted   Primary osteoarthritis of left hip 09/22/2024   Status post total replacement of right hip 11/14/2020   Prediabetes 08/19/2020   Hypercholesteremia 08/19/2020   Past Medical History:  Diagnosis Date   Arthritis    Hyperlipidemia    Pre-diabetes    Skin cancer    Face   Sleep apnea    Does not wear CPAP    Family History  Problem Relation Age of Onset   Stroke Mother    Colon cancer Neg Hx    Colon polyps Neg Hx    Esophageal cancer Neg Hx    Stomach cancer Neg Hx    Rectal cancer Neg Hx     Past Surgical History:  Procedure Laterality Date   BACK SURGERY  2007   pt. reports it was about 20 yrs. ago, no problems since then  .  Lower back   HAND TENDON SURGERY Left 1999   occupational injury involving artery as well   HERNIA REPAIR Left 1975   inguinal    INGUINAL HERNIA REPAIR Left 04/16/2017   Procedure: LAPAROSCOPIC LEFT INGUINAL HERNIA REPAIR;  Surgeon: Signe Mitzie LABOR, MD;  Location: [facility] OR;  Service: General;  Laterality: Left;   INGUINAL HERNIA REPAIR Left 03/14/2021   Procedure: HERNIA REPAIR LEFT INGUINAL ADULT;  Surgeon: Rubin Calamity, MD;  Location: [facility];  Service: General;  Laterality: Left;  90MIN/RM6   INSERTION OF MESH Left 04/16/2017   Procedure: INSERTION OF MESH;  Surgeon: Signe Mitzie LABOR, MD;  Location: [facility];  Service: General;  Laterality: Left;   TOTAL HIP ARTHROPLASTY Right 11/14/2020   Procedure: RIGHT TOTAL HIP ARTHROPLASTY ANTERIOR APPROACH;  Surgeon: Jerri Kay HERO, MD;  Location: [facility];  Service: Orthopedics;  Laterality: Right;  request 3C bed   TOTAL HIP ARTHROPLASTY Left 11/20/2024   Procedure: LEFT TOTAL HIP ARTHROPLASTY, ANTERIOR APPROACH;  Surgeon: Jerri Kay HERO, MD;  Location: [facility];  Service: Orthopedics;  Laterality: Left;   Social History   Occupational History   Occupation: Full time  Tobacco Use  Smoking status: Never   Smokeless tobacco: Never  Vaping Use   Vaping status: Never Used  Substance and Sexual Activity   Alcohol  use: Not Currently    Comment: rarely   Drug use: No   Sexual activity: Not Currently     "

## 2025-02-16 ENCOUNTER — Encounter: Admitting: Physician Assistant
